# Patient Record
Sex: Female | Born: 1940 | Race: White | Hispanic: No | Smoking: Never smoker
Health system: Southern US, Community
[De-identification: ages and names within clinical notes are randomized; demographics above are authoritative.]

## PROBLEM LIST (undated history)

## (undated) DIAGNOSIS — F32A Depression, unspecified: Secondary | ICD-10-CM

## (undated) DIAGNOSIS — K449 Diaphragmatic hernia without obstruction or gangrene: Secondary | ICD-10-CM

## (undated) DIAGNOSIS — F329 Major depressive disorder, single episode, unspecified: Secondary | ICD-10-CM

## (undated) DIAGNOSIS — G588 Other specified mononeuropathies: Secondary | ICD-10-CM

## (undated) DIAGNOSIS — L0232 Furuncle of buttock: Secondary | ICD-10-CM

## (undated) DIAGNOSIS — M8580 Other specified disorders of bone density and structure, unspecified site: Secondary | ICD-10-CM

## (undated) DIAGNOSIS — F419 Anxiety disorder, unspecified: Secondary | ICD-10-CM

## (undated) DIAGNOSIS — M797 Fibromyalgia: Secondary | ICD-10-CM

## (undated) DIAGNOSIS — M419 Scoliosis, unspecified: Secondary | ICD-10-CM

## (undated) DIAGNOSIS — D649 Anemia, unspecified: Secondary | ICD-10-CM

## (undated) DIAGNOSIS — K589 Irritable bowel syndrome without diarrhea: Secondary | ICD-10-CM

## (undated) DIAGNOSIS — C801 Malignant (primary) neoplasm, unspecified: Secondary | ICD-10-CM

## (undated) DIAGNOSIS — M810 Age-related osteoporosis without current pathological fracture: Secondary | ICD-10-CM

## (undated) HISTORY — DX: Scoliosis, unspecified: M41.9

## (undated) HISTORY — PX: FOOT SURGERY: SHX648

## (undated) HISTORY — PX: NECK SURGERY: SHX720

## (undated) HISTORY — DX: Fibromyalgia: M79.7

## (undated) HISTORY — DX: Anemia, unspecified: D64.9

## (undated) HISTORY — DX: Irritable bowel syndrome without diarrhea: K58.9

## (undated) HISTORY — PX: CHOLECYSTECTOMY: SHX55

## (undated) HISTORY — DX: Other specified mononeuropathies: G58.8

## (undated) HISTORY — DX: Furuncle of buttock: L02.32

## (undated) HISTORY — DX: Malignant (primary) neoplasm, unspecified: C80.1

## (undated) HISTORY — DX: Age-related osteoporosis without current pathological fracture: M81.0

## (undated) HISTORY — PX: SHOULDER SURGERY: SHX246

## (undated) HISTORY — DX: Other specified disorders of bone density and structure, unspecified site: M85.80

## (undated) HISTORY — PX: GALLBLADDER SURGERY: SHX652

---

## 2000-06-03 ENCOUNTER — Inpatient Hospital Stay (HOSPITAL_COMMUNITY): Admission: EM | Admit: 2000-06-03 | Discharge: 2000-06-04 | Payer: Self-pay | Admitting: Obstetrics and Gynecology

## 2000-06-03 ENCOUNTER — Encounter: Payer: Self-pay | Admitting: Obstetrics and Gynecology

## 2000-06-04 ENCOUNTER — Encounter: Payer: Self-pay | Admitting: Internal Medicine

## 2001-05-03 ENCOUNTER — Ambulatory Visit (HOSPITAL_COMMUNITY): Admission: RE | Admit: 2001-05-03 | Discharge: 2001-05-03 | Payer: Self-pay | Admitting: Neurosurgery

## 2001-05-14 ENCOUNTER — Inpatient Hospital Stay (HOSPITAL_COMMUNITY): Admission: RE | Admit: 2001-05-14 | Discharge: 2001-05-19 | Payer: Self-pay | Admitting: Neurosurgery

## 2001-06-30 ENCOUNTER — Ambulatory Visit (HOSPITAL_COMMUNITY): Admission: RE | Admit: 2001-06-30 | Discharge: 2001-06-30 | Payer: Self-pay | Admitting: Neurosurgery

## 2001-08-25 ENCOUNTER — Encounter (HOSPITAL_COMMUNITY): Admission: RE | Admit: 2001-08-25 | Discharge: 2001-09-24 | Payer: Self-pay | Admitting: Neurosurgery

## 2003-02-07 ENCOUNTER — Emergency Department (HOSPITAL_COMMUNITY): Admission: EM | Admit: 2003-02-07 | Discharge: 2003-02-08 | Payer: Self-pay | Admitting: *Deleted

## 2003-02-07 ENCOUNTER — Encounter: Payer: Self-pay | Admitting: *Deleted

## 2003-02-17 ENCOUNTER — Encounter: Payer: Self-pay | Admitting: *Deleted

## 2003-02-17 ENCOUNTER — Ambulatory Visit (HOSPITAL_COMMUNITY): Admission: RE | Admit: 2003-02-17 | Discharge: 2003-02-17 | Payer: Self-pay | Admitting: *Deleted

## 2003-02-24 ENCOUNTER — Ambulatory Visit (HOSPITAL_COMMUNITY): Admission: RE | Admit: 2003-02-24 | Discharge: 2003-02-24 | Payer: Self-pay | Admitting: Emergency Medicine

## 2003-02-24 ENCOUNTER — Encounter: Payer: Self-pay | Admitting: Emergency Medicine

## 2003-05-05 ENCOUNTER — Ambulatory Visit (HOSPITAL_COMMUNITY): Admission: RE | Admit: 2003-05-05 | Discharge: 2003-05-05 | Payer: Self-pay | Admitting: Thoracic Surgery

## 2003-05-05 ENCOUNTER — Encounter: Payer: Self-pay | Admitting: Thoracic Surgery

## 2003-08-31 ENCOUNTER — Ambulatory Visit (HOSPITAL_COMMUNITY): Admission: RE | Admit: 2003-08-31 | Discharge: 2003-08-31 | Payer: Self-pay | Admitting: Family Medicine

## 2003-09-17 ENCOUNTER — Ambulatory Visit (HOSPITAL_COMMUNITY): Admission: RE | Admit: 2003-09-17 | Discharge: 2003-09-17 | Payer: Self-pay | Admitting: Thoracic Surgery

## 2003-09-17 ENCOUNTER — Encounter: Payer: Self-pay | Admitting: Thoracic Surgery

## 2004-03-27 ENCOUNTER — Ambulatory Visit (HOSPITAL_COMMUNITY): Admission: RE | Admit: 2004-03-27 | Discharge: 2004-03-27 | Payer: Self-pay | Admitting: Thoracic Surgery

## 2004-04-26 ENCOUNTER — Ambulatory Visit (HOSPITAL_COMMUNITY): Admission: RE | Admit: 2004-04-26 | Discharge: 2004-04-26 | Payer: Self-pay | Admitting: Family Medicine

## 2006-05-20 ENCOUNTER — Emergency Department (HOSPITAL_COMMUNITY): Admission: EM | Admit: 2006-05-20 | Discharge: 2006-05-20 | Payer: Self-pay | Admitting: Emergency Medicine

## 2006-05-28 ENCOUNTER — Ambulatory Visit (HOSPITAL_COMMUNITY): Admission: RE | Admit: 2006-05-28 | Discharge: 2006-05-28 | Payer: Self-pay | Admitting: Family Medicine

## 2006-06-10 ENCOUNTER — Ambulatory Visit (HOSPITAL_COMMUNITY): Admission: RE | Admit: 2006-06-10 | Discharge: 2006-06-10 | Payer: Self-pay | Admitting: Family Medicine

## 2006-07-11 ENCOUNTER — Ambulatory Visit (HOSPITAL_COMMUNITY): Admission: RE | Admit: 2006-07-11 | Discharge: 2006-07-11 | Payer: Self-pay | Admitting: Family Medicine

## 2006-09-17 ENCOUNTER — Ambulatory Visit (HOSPITAL_COMMUNITY): Admission: RE | Admit: 2006-09-17 | Discharge: 2006-09-17 | Payer: Self-pay | Admitting: Pulmonary Disease

## 2007-06-03 ENCOUNTER — Ambulatory Visit (HOSPITAL_COMMUNITY): Admission: RE | Admit: 2007-06-03 | Discharge: 2007-06-03 | Payer: Self-pay | Admitting: Family Medicine

## 2007-06-24 ENCOUNTER — Ambulatory Visit (HOSPITAL_COMMUNITY): Admission: RE | Admit: 2007-06-24 | Discharge: 2007-06-24 | Payer: Self-pay | Admitting: Family Medicine

## 2009-01-22 ENCOUNTER — Inpatient Hospital Stay (HOSPITAL_COMMUNITY): Admission: EM | Admit: 2009-01-22 | Discharge: 2009-01-25 | Payer: Self-pay | Admitting: Emergency Medicine

## 2009-06-14 ENCOUNTER — Encounter: Admission: RE | Admit: 2009-06-14 | Discharge: 2009-06-14 | Payer: Self-pay | Admitting: Neurosurgery

## 2009-09-14 ENCOUNTER — Encounter: Admission: RE | Admit: 2009-09-14 | Discharge: 2009-09-14 | Payer: Self-pay | Admitting: Neurosurgery

## 2010-02-22 ENCOUNTER — Ambulatory Visit (HOSPITAL_COMMUNITY): Admission: RE | Admit: 2010-02-22 | Discharge: 2010-02-22 | Payer: Self-pay | Admitting: Family Medicine

## 2010-03-15 ENCOUNTER — Ambulatory Visit (HOSPITAL_COMMUNITY): Admission: RE | Admit: 2010-03-15 | Discharge: 2010-03-15 | Payer: Self-pay | Admitting: Family Medicine

## 2010-11-22 ENCOUNTER — Ambulatory Visit (HOSPITAL_COMMUNITY)
Admission: RE | Admit: 2010-11-22 | Discharge: 2010-11-22 | Payer: Self-pay | Source: Home / Self Care | Attending: Family Medicine | Admitting: Family Medicine

## 2010-12-31 ENCOUNTER — Encounter: Payer: Self-pay | Admitting: Thoracic Surgery

## 2011-03-27 LAB — COMPREHENSIVE METABOLIC PANEL
Albumin: 3.1 g/dL — ABNORMAL LOW (ref 3.5–5.2)
BUN: 12 mg/dL (ref 6–23)
CO2: 29 mEq/L (ref 19–32)
Calcium: 8.2 mg/dL — ABNORMAL LOW (ref 8.4–10.5)
Chloride: 105 mEq/L (ref 96–112)
Creatinine, Ser: 0.86 mg/dL (ref 0.4–1.2)
GFR calc Af Amer: >60 mL/min (ref >60)
GFR calc non Af Amer: >60 mL/min (ref >60)
Glucose, Bld: 137 mg/dL — ABNORMAL HIGH (ref 70–99)

## 2011-03-27 LAB — URINE MICROSCOPIC-ADD ON

## 2011-03-27 LAB — URINALYSIS, ROUTINE W REFLEX MICROSCOPIC
Bilirubin Urine: NEGATIVE
Protein, ur: NEGATIVE mg/dL

## 2011-03-27 LAB — CBC
HCT: 32.6 % — ABNORMAL LOW (ref 36.0–46.0)
Hemoglobin: 11.1 g/dL — ABNORMAL LOW (ref 12.0–15.0)
MCV: 97.3 fL (ref 78.0–100.0)
RBC: 3.35 MIL/uL — ABNORMAL LOW (ref 3.87–5.11)
WBC: 10.3 10*3/uL (ref 4.0–10.5)

## 2011-03-27 LAB — DIFFERENTIAL
Basophils Absolute: 0 10*3/uL (ref 0.0–0.1)
Basophils Relative: 0 % (ref 0–1)
Lymphocytes Relative: 7 % — ABNORMAL LOW (ref 12–46)
Lymphs Abs: 0.7 10*3/uL (ref 0.7–4.0)
Monocytes Relative: 9 % (ref 3–12)

## 2011-03-27 LAB — CARDIAC PANEL(CRET KIN+CKTOT+MB+TROPI)
CK, MB: 1.2 ng/mL (ref 0.3–4.0)
CK, MB: 1.2 ng/mL (ref 0.3–4.0)
Relative Index: INVALID (ref 0.0–2.5)
Relative Index: INVALID (ref 0.0–2.5)
Total CK: 52 U/L (ref 7–177)
Total CK: 52 U/L (ref 7–177)
Troponin I: <0.01 ng/mL (ref 0.00–0.06)

## 2011-03-27 LAB — BASIC METABOLIC PANEL
BUN: 6 mg/dL (ref 6–23)
Chloride: 109 mEq/L (ref 96–112)
Creatinine, Ser: 0.68 mg/dL (ref 0.4–1.2)
GFR calc non Af Amer: >60 mL/min (ref >60)
Potassium: 3.4 mEq/L — ABNORMAL LOW (ref 3.5–5.1)

## 2011-04-24 NOTE — Procedures (Signed)
Krystal Carpenter, Krystal Carpenter                ACCOUNT NO.:  [id number]   MEDICAL RECORD NO.:  [id number]          PATIENT TYPE:  OUT   LOCATION:  RESP                          FACILITY:  [facility]   PHYSICIAN:  Edward L. Juanetta Gosling, M.D.DATE OF BIRTH:  Oct 14, 1941   DATE OF PROCEDURE:  06/24/2007  DATE OF DISCHARGE:  06/24/2007                            PULMONARY FUNCTION TEST   FINDINGS:  1. Spirometry shows no definite ventilatory defect but does show      evidence of air flow obstruction.  2. Lung volumes show air-trapping.  3. DLCO is normal.      Edward L. Juanetta Gosling, M.D.  Electronically Signed     ELH/MEDQ  D:  06/25/2007  T:  06/26/2007  Job:  [id number]   cc:   Lorin Picket A. Gerda Diss, MD  Fax: [phone]

## 2011-04-24 NOTE — H&P (Signed)
[name], Carpenter                ACCOUNT NO.:  [id number]   MEDICAL RECORD NO.:  [id number]          PATIENT TYPE:  INP   LOCATION:  A313                          FACILITY:  [facility]   PHYSICIAN:  Lonia Blood, M.D.      DATE OF BIRTH:  [birth year]   DATE OF ADMISSION:  01/22/2009  DATE OF DISCHARGE:  LH                              HISTORY & PHYSICAL   PRIMARY CARE PHYSICIAN:  Dr. Gerda Diss.   PRESENTING COMPLAINT:  Back pain.   HISTORY OF PRESENT ILLNESS:  The patient is a 70 year old female with a  history of fibromyalgia, COPD and depression who has apparently had  longstanding history in her left lower back.  This has gotten worse  lately and today she was unable to put weight on her feet.  The pain is  in the lower back, rated as 10/10, radiating down her left hip and left  leg.  She denied any recent trauma.  She has denied any history of  cancer.  No fever.  She took some pain medicine at home but that is not  relieving her pain.  She was treated in the emergency room also with  some IV pain medicine but she remained in intractable pain, hence she is  being admitted for further management.   PAST MEDICAL HISTORY:  Significant for COPD, fibromyalgia, depression.   ALLERGIES:  She has allergy to PENICILLIN.   MEDICATIONS:  1. Effexor 75 mg daily.  2. Trazodone 50 mg daily.   SOCIAL HISTORY:  [place]  Denied  any active tobacco, alcohol or IV drug use.   FAMILY HISTORY:  Not significant.   REVIEW OF SYSTEMS:  A 14 point review of systems was negative except for  HPI.   PHYSICAL EXAMINATION:  VITAL SIGNS:  Temperature 98.3, blood pressure  128/71, pulse 84, respiratory rate 18, saturation 98% on room air.  GENERAL:  The patient is acutely ill looking.  She looks in distress due  to pain.  Talking is somewhat difficult.  HEENT:  PERRL, EOMI.  NECK:  Supple.  No JVD, no lymphadenopathy.  LUNGS:  She has good air entry  bilaterally.  No  wheezing, no rales.  CARDIOVASCULAR:  The patient is  S1, S2, no murmur.  ABDOMEN:  Soft, nontender.  Positive bowel sounds.  EXTREMITIES:  Show no edema, cyanosis or clubbing.  NEUROLOGICAL:  Cranial nerves II-XII appear to be intact.  Her power is 5/5 of her  upper and lower extremities respectively.  She has a mild left-sided  positive __________.  Otherwise no other significant findings.   LABS:  Currently pending.   X-ray of the lumbar spine one view showed moderate lumbar scoliosis,  mild to moderate multilevel degenerative disk disease and spondylosis,  no acute abnormalities.   ASSESSMENT:  This is a 70 year old female with known history of  fibromyalgia but also lower back pain presenting with intractable back  pain.  The cause is not entirely clear on the x-ray but this could be  due to a number of reasons including degenerative disk  disease as well  as some element of sciatica.   PLAN:  1. Intractable pain.  Will admit the patient for inpatient management.      Mainly pain control.  Will check MRI of the L-spine.  Put her on      some bed rest.  For 24 hour pain will get PT and OT to see the      patient.  Once the patient's condition improves will set her up for      possible neurologic neurosurgical evaluation if her MRI warrants      it.  2. History of depression with fibromyalgia.  Will continue her home      medication at this point.  Any further treatment will depend on how      the patient does with these initial measures.      Lonia Blood, M.D.  Electronically Signed     LG/MEDQ  D:  01/23/2009  T:  01/23/2009  Job:  [id number]

## 2011-04-24 NOTE — Discharge Summary (Signed)
NAMECHRYSTEN, Krystal Carpenter                ACCOUNT NO.:  [id number]   MEDICAL RECORD NO.:  [id number]          PATIENT TYPE:  INP   LOCATION:  A313                          FACILITY:  [facility]   PHYSICIAN:  Scott A. Gerda Diss, MD    DATE OF BIRTH:  07/15/1941   DATE OF ADMISSION:  01/22/2009  DATE OF DISCHARGE:  LH                               DISCHARGE SUMMARY   DISCHARGE DIAGNOSES:  1. Severe sciatica due to foraminal impingement of the nerve.  2. Bulging in disk with degenerative back disease.  3. Fibromyalgia.  4. Urinary tract infection.  5. Musculoskeletal chest pain.  6. Reflux.   HOSPITAL COURSE:  This patient was admitted with intractable pain,  unable to put any weight on her leg.  The severe pain discomfort  radiating down the leg became worse over the previous few days.  She was  admitted and placed on Lovenox as a precaution and had significant  discomfort, required pain medication.  The first pain medicine might  have been little strong, and she had some problems with O2 sat being  low.  She was given O2 and she was able to converse and it should also  be noted that the patient had a fever of 100.9 at that point.  She in  addition to this have a chest x-ray, which showed no acute disease, and  she was placed on lower dose of pain medicine.  She also had a urine  done.  This urine showed a microscopic, some wbc's, so she is placed on  Levaquin to cover for probable UTI, and this did in fact help.  Her  urine culture was never done, so treatment was presumptive treatment  based on urinalysis.  The patient was able to move some with physical  therapy and it should also be noted she had a little bit of indigestion  on Sunday, was placed on Protonix and Maalox and she also had some  musculoskeletal pain early Monday morning, cardiac enzymes ruled out,  and EKG looked good.  The patient did have an MRI done and this MRI  showed mild bulging disk at multiple levels and degenerative disk  at L4-  L5 and a facet degeneration bilateral with moderate left foraminal  narrowing and mild central canal stenosis.  It was felt that this was  most likely pressing on the nerve and causing her sciatic.  She was  given a dose of Solu-Medrol and she was felt stable for discharge on the  16th with plan for discharge of the prednisone taper along with setting  her up with neurosurgery and Levaquin for few days for UTI coverage and  Vicodin as needed for severe pain.  She was instructed to followup in  the office on p.r.n. basis.  We will call her within the next couple  days per Neurosurgery appointment, and she certainly can call us with  any questions or concerns.      Scott A. Gerda Diss, MD  Electronically Signed     SAL/MEDQ  D:  01/25/2009  T:  01/25/2009  Job:  [id number]

## 2011-04-27 NOTE — Op Note (Signed)
[place]. [facility]  Patient:    Krystal Carpenter, Krystal Carpenter                       MRN: [id number] Proc. Date: 05/14/01 Adm. Date:  [id number] Attending:  Tressie Stalker D                           Operative Report  PREOPERATIVE DIAGNOSES:  Chiari I malformation.  POSTOPERATIVE DIAGNOSIS:  Chiari I malformation.  PROCEDURES: 1. Suboccipital craniectomy. 2. C1 laminectomy. 3. Duraplasty.  SURGEON:  Cristi Loron, M.D.  ASSISTANT:  Danae Orleans. Venetia Maxon, M.D.  ANESTHESIA:  General endotracheal.  ESTIMATED BLOOD LOSS:  150 cc.  SPECIMENS:  None.  DRAINS:  None.  COMPLICATIONS:  None.  BRIEF HISTORY:  The patient is a 70 year old white female who has suffered from one year of neck and shoulder pain.  She failed medical management and was worked up as an outpatient with a cervical MRI, which demonstrated some mild degenerative disease and spondylosis but also a Chiari I malformation. She failed medical management and therefore weighed the risks, benefits, and alternatives of surgery and decided to proceed with decompression.  DESCRIPTION OF PROCEDURE:  The patient was brought to the operating room by the anesthesia team.  General endotracheal anesthesia was induced.  I then applied the Mayfield three-point head rest to the patients calvarium.  She was then turned to the prone position on the chest rolls.  Her suboccipital region was then shaved and prepared with Betadine scrub and Betadine solution. Sterile drapes were applied.  I then injected the area to be incised with Marcaine with epinephrine solution and used a scalpel to make a midline incision over the suboccipital and upper cervical region.  I used electrocautery to dissect down to perform a subperiosteal dissection, stripping the cervical musculature from the suboccipital bone as well as the C1 and C2 laminae.  I inserted the cerebellar retractor for exposure.  I then used a high-speed drill to  thin out the suboccipital bone, i.e., drilled out until there was a thin shell of bone over the dura.  I also used the high-speed drill to drill off the posterior lamina of the C1 ring.  I then used the Kerrison punch to remove the remainder of the suboccipital bone, exposing the suboccipital dura as well as remove the remainder of the C1 ring. I cleared the soft tissue from overlying the dura.  There was an occipital fibrous band at the foramen magnum, which I removed using the nerve hook and the Kerrison punch.  The patient did have a rather large occipital dural sinus, which bled profusely when I cut it, and I controlled it with electrocautery and vascular clips.  I cut the dura in a Y-shaped fashion, and this did compress the cerebellar tonsils and the upper surface of the spinal cord.  There appeared to be good flow of CSF.  I then obtained Dura-Guard pericardium and performed a duraplasty by using a combination of interrupted and running 4-0 Nurolon suture.  I got a pretty good dural closure.  I had anesthesia Valsalva the patient, and there was no CSF leak.  I then copiously irrigated the wound with bacitracin solution and removed the solution.  I achieved stringent hemostasis using bipolar electrocautery.  I then removed the cerebellar retractor and reapproximated the patients cervical fascia with interrupted #1 Vicryl suture as well as the subcutaneous tissue with interrupted  2-0 Vicryl suture.  Skin was then reapproximated with Steri-Strips with benzoin.  The wound was then coated with bacitracin ointment and a sterile dressing applied.  The drapes were removed, and the patient was subsequently returned to the supine position, and the Mayfield three-point head rest was removed from her calvarium.  The patient was subsequently extubated by the anesthesia team and transported to the postanesthesia care unit in stable condition.  All sponge, instrument, and needle counts were correct  at the end of this case. DD:  05/14/01 TD:  05/15/01 Job: [id number]

## 2011-04-27 NOTE — Procedures (Signed)
   NAMEDREMA, EDDINGTON                            ACCOUNT NO.:  [id number]   MEDICAL RECORD NO.:  [id number]                    PATIENT TYPE:   LOCATION:                                       FACILITY:   PHYSICIAN:  Edward L. Juanetta Gosling, M.D.             DATE OF BIRTH:   DATE OF PROCEDURE:  DATE OF DISCHARGE:                              PULMONARY FUNCTION TEST   IMPRESSION:  1. Spirometry shows airflow obstruction at the level of the small airways.  2. There is no significant bronchodilator improvement.      ___________________________________________                                            Oneal Deputy. Juanetta Gosling, M.D.   ELH/MEDQ  D:  09/02/2003  T:  09/03/2003  Job:  [id number]   cc:   Lorin Picket A. Gerda Diss, M.D.  [address], [address]  [place]  [place] [address]  Fax: [phone]

## 2011-04-27 NOTE — H&P (Signed)
[place]. [facility]  Patient:    Krystal Carpenter, Krystal Carpenter                       MRN: [id number] Adm. Date:  [id number] Attending:  Tressie Stalker D                         History and Physical  CHIEF COMPLAINT:  Neck pain.  HISTORY OF PRESENT ILLNESS:  The patient is a 70 year old white female who began having neck pain about 1 year ago.  She had been treated with multiple modalities including chiropractic care, medications, rest etc.  She failed to improve and was worked up with an cervical MRI that demonstrated a Chiari-1 malformation.  The patient was kindly sent for my consultation.  She complains of posterior cervical pain with some radiation to her bilateral trapezius as well as pain "at the base of my skull."  She has not noticed any significant numbness, tingling, or weakness, ataxia, incontinence.  She has been getting worse despite medical management.  PAST MEDICAL HISTORY:  Positive for irritable bowel syndrome, a remote history of cholecystitis/cholelithiasis, depression, scoliosis and intermittent sciatica.  PAST SURGICAL HISTORY:  Foot surgery 1998, shoulder surgery 1997 on the right, cholecystectomy 2000.  MEDICATIONS:  Prior to admission: 1. Effexor 75 mg p.o. q. day. 2. Prempro 0.625/25 1 p.o. q. day. 3. Trazodone 50 mg h.s. p.r.n.  DRUG ALLERGIES:  PENICILLIN causes hives and swelling.  FAMILY HISTORY:  The patients mother died in her 70s of congestive heart failure.  The patients father died at age 70 of probable heart disease.  SOCIAL HISTORY:  The patient is divorced.  She has 3 children.  She lives in [place], tobacco, ethanol or drug use.  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  REVIEW OF SYSTEMS:  Negative except as above.  GENERAL:  A pleasant well-nourished well-developed 70 year old white female complaining of neck pain.  VITAL SIGNS:  Height 5 foot 4 inches, weight 124  pounds.  HEENT:  Normal.  NECK:  She has limited cervical range of motion.  She has diffuse tenderness to palpation, no point tenderness or obvious deformities.  Spurlings test is negative bilaterally with limited motion.  lhermittes sign was not present. There was no jugular venous distension, carotid bruits, masses, etc.  Thorax is symmetric.  LUNGS:  Clear to auscultation.  HEART:  Regular rate and rhythm.  ABDOMEN:  Soft, nontender.  EXTREMITIES:  She has a well-healed right shoulder scar with no signs of infection with a normal range of motion.  BACK EXAM:  The patient has some scoliosis but no other obvious deformities. faberes testing and straight leg raise testing were negative bilaterally.  NEUROLOGIC:  The patient is alert and oriented x 3.  Cranial nerves II-XII are grossly intact bilaterally.  Vision is grossly normal bilaterally with corrective lenses.  Hearing grossly normal bilaterally.  Motor strength is 5/5 in the bilateral deltoid, bicep, tricep, hand grip and wrist extensor. Interosseous psoas, quadriceps, gastrocnemius, extensor hallucis longus, cerebellar exam is intact to rapid alternating movements in the upper extremity bilaterally.  Sensory exam is normal to light touch and pinprick sensation in all tested dermatomes bilaterally.  Deep tendon reflexes are 2+/4 in the bilateral biceps, triceps, brachial radialis, 3/4 in her bilateral quadriceps, and gastrocnemius.  She has bilateral flexor plantar responses and a 4 beat nonsustained ankle clonus bilaterally.  IMAGING STUDIES:  The patient had a cervical MRI performed without contrast at [facility] on Apr 23, 2001.  This demonstrates a mildly straightened cervical spine with some mild bulging disks and spondylosis but no significant neural compression.  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  ASSESSMENT AND PLAN: 1. Chiari-1 malformation.  I have discussed  the situation with the patient and    her daughter and reviewed the MRI scan with them pointing out the    abnormalities.  Her symptoms are consistent with a symptomatic Chiari-1    malformation.  I have discussed the various treatment options with her    including continued medical management, serial MRIs, and surgery.  I have    described the procedure of a suboccipital craniectomy with C1 and possibly    C2 laminectomy and duraplasty.  I have described the surgery to them    showing them surgical models and discussed the risks of surgery    extensively.  The patient has weighed the risks, benefits, and alternatives of surgery and has decided to proceed with the operation on May 14, 2001.  The patient has a normal brain MRI other than the Chiari-1 malformation. DD:  05/14/01 TD:  05/14/01 Job: [id number]

## 2011-04-27 NOTE — Discharge Summary (Signed)
[name]. [facility]  Patient:    Krystal Carpenter, Krystal Carpenter Visit Number: [id number] MRN: [id number]          Service Type: [facility] Attending Physician:  Tressie Stalker D Dictated by:   Cristi Loron, M.D. Admit Date:  08/25/2001 Discharge Date: 09/24/2001                             Discharge Summary  For full details of this admission, please refer to the typed History and Physical.  BRIEF HISTORY:  The patient is a 70 year old white female who suffered from one year of neck and shoulder pain.  She failed medical management, worked up as an outpatient with a cervical MRI which demonstrated a some mild degenerative disease and spondylosis but she also had a Chiari I malformation. She failed medical management and therefore weighed the risks, benefits, alternatives of surgery and decided to proceed with a Chiari I decompression.  For further details of this procedure, please refer to the History and Physical.  HOSPITAL COURSE:  I admitted the patient to [facility] on May 14, 2001, with the diagnosis of a Chiari I malformation.  On the day of admission I performed a suboccipital craniectomy with C1 laminectomy and duraplasty. The surgery went well without complications.  (For further details of this operation, please refer to the typed operative note.)  POSTOPERATIVE COURSE:  The patients postoperative course was essentially unremarkable.  She was initially monitored in the neurosurgical intensive care unit.  She had some persistent nausea which was treated with medications.  She was transferred to the neurosurgical general floor and observed.  She continued to complain of headaches.  We got a cranial CT scan which demonstrated expected post-surgical changes and no evidence of hydrocephalus. By May 19, 2001, the patient requested discharge to home.  She was afebrile. Vital signs were stable.  She was eating well.  Her wound was healing well without  signs of infection.  She was neurologically normal.  She was therefore discharged to home on May 19, 2001.  DISCHARGE INSTRUCTIONS:  The patient was instructed to follow up with me in two weeks, to resume outpatient medical regimen, and she was given written discharge instructions.  FINAL DIAGNOSIS:  Chiari I malformation.  PROCEDURE PERFORMED:  A suboccipital craniectomy, C1 laminectomy, and duraplasty. Dictated by:   Cristi Loron, M.D. Attending Physician:  Tressie Stalker D DD:  01/15/02 TD:  01/16/02 Job: [id number]

## 2011-04-27 NOTE — Discharge Summary (Signed)
[place]. [facility]  Patient:    Krystal Carpenter, Krystal Carpenter                       MRN: [id number] Adm. Date:  [id number] Disc. Date: 06/04/00 Attending:  Pricilla Riffle Dictator:   Delton See, P.A. CC:         Lilyan Punt, M.D. [address] [place], [place] [address]             Dietrich Pates, M.D. [facility]                           Discharge Summary  HISTORY OF PRESENT ILLNESS:  Ms. Sisler is a 70 year old female with a history of a normal heart catheterization three years ago at [facility]. Apparently, this was performed by Arturo Morton. Riley Kill, M.D. The patient developed chest pain around 8:30 or 9 a.m. on the a.m. of admission. She was working in a [employer]. She got very hot. She developed chest pain. She took some Tums without relief. She drank some Coke without relief. She had her daughter drive her to the emergency room. The symptoms were similar to the pain she experienced prior to her catheterization in the past. Her pain was eventually relieved with nitroglycerin and later morphine.  Cardiac risk factors were negative for hypertension, negative for diabetes, positive for elevated cholesterol, negative for tobacco, negative for obesity. Her mother had an enlarged heart. Her father had some sort of heart trouble. Her brother had an MI in his 51s.  PAST MEDICAL HISTORY:  Significant for irritable bowel syndrome, status post cholecystectomy, history of hiatal hernia.  ALLERGIES:  PENICILLIN.  MEDICATIONS:  1. Effexor 75 mg daily.  2. Trazodone 50 mg at bedtime.  3. She was started on IV nitroglycerin at [facility]. She had four     baby aspirin there as well.  4. She is also on Prempro 0.625/2.5 mg daily.  5. Fosamax once each week.  6. She was started on Lovenox at [facility].  SOCIAL HISTORY:  The patient lives in [place]. She is divorced. She has three children. She works in [employer]  HOSPITAL COURSE:  As noted, this patient was admitted to [facility] in transfer from [facility] for evaluation of chest pain. She has a history of a normal catheterization three to four years ago. She was noted to be bradycardic at time of admission with a heart rate of 54 beats per minute. She ruled out for an MI with negative cardiac enzymes.  A CT scan of the chest was performed to rule out pulmonary embolus. This showed no significant abnormality and no central pulmonary embolism.  On June 04, 2000, the patient underwent an adenosine Cardiolite stress test. This was interpreted as negative for ischemia with an ejection fraction of 77%. Arrangements were made to discharge the patient later that evening in improved condition. She would follow up with Dr. Gerda Diss for further evaluation.  LABORATORY DATA:  Please see results of Cardiolite study and spiral CT as noted above. Apparently, her cardiac enzymes were negative. There are no lab values in the chart at this time.  An EKG showed sinus bradycardia with a short P-R interval, rate 54 beats per minute.  A fasting lipid profile was performed, and results are pending.  DISCHARGE MEDICATIONS:  The patient was discharged on the same medications she was  on prior to admission, with the addition of Protonix 40 mg daily.  ACTIVITIES:  As tolerated.  DIET:  The patient was told to stay on her previous diet.  FOLLOW-UP:  She was to follow up with Dr. Gerda Diss in one to two weeks. Dietrich Pates, M.D. as needed.  PROBLEM LIST AT TIME OF DISCHARGE:  1. Chest pain. Myocardial infarction ruled out.  2. Negative adenosine Cardiolite. Ejection fraction 77%.  3. Negative spiral CT scan.  4. History of anxiety and depression.  5. History of normal cardiac catheterization three to four years ago.  6. History of elevated cholesterol, lipid profile pending.  7. Positive family history of coronary artery disease.  8.  Irritable bowel syndrome.  9. Gastroesophageal reflux disease and hiatal hernia. 10. Status post cholecystectomy. 11. Bradycardia of uncertain etiology. DD:  06/04/00 TD:  06/04/00 Job: [id number]

## 2011-06-28 ENCOUNTER — Other Ambulatory Visit: Payer: Self-pay | Admitting: Neurosurgery

## 2011-06-28 DIAGNOSIS — M419 Scoliosis, unspecified: Secondary | ICD-10-CM

## 2011-07-05 ENCOUNTER — Other Ambulatory Visit: Payer: Self-pay | Admitting: Neurosurgery

## 2011-07-05 ENCOUNTER — Ambulatory Visit
Admission: RE | Admit: 2011-07-05 | Discharge: 2011-07-05 | Disposition: A | Discharge status: Home / Self Care | Payer: Medicare Other | Source: Ambulatory Visit | Attending: Neurosurgery | Admitting: Neurosurgery

## 2011-07-05 DIAGNOSIS — M419 Scoliosis, unspecified: Secondary | ICD-10-CM

## 2011-07-05 MED ORDER — IOHEXOL 180 MG/ML  SOLN
1.0000 mL | Freq: Once | INTRAMUSCULAR | Status: AC | PRN
  Administered 2011-07-05: 1 mL via EPIDURAL

## 2011-07-05 MED ORDER — METHYLPREDNISOLONE ACETATE 40 MG/ML INJ SUSP (RADIOLOG
120.0000 mg | Freq: Once | INTRAMUSCULAR | Status: AC
  Administered 2011-07-05: 120 mg via EPIDURAL

## 2011-08-01 ENCOUNTER — Ambulatory Visit (INDEPENDENT_AMBULATORY_CARE_PROVIDER_SITE_OTHER): Payer: Medicare Other | Admitting: Orthopedic Surgery

## 2011-08-01 ENCOUNTER — Encounter: Payer: Self-pay | Admitting: Orthopedic Surgery

## 2011-08-01 VITALS — Ht 63.0 in | Wt 125.0 lb

## 2011-08-01 DIAGNOSIS — M25519 Pain in unspecified shoulder: Secondary | ICD-10-CM

## 2011-08-01 DIAGNOSIS — M7500 Adhesive capsulitis of unspecified shoulder: Secondary | ICD-10-CM

## 2011-08-01 DIAGNOSIS — M719 Bursopathy, unspecified: Secondary | ICD-10-CM

## 2011-08-01 DIAGNOSIS — M75101 Unspecified rotator cuff tear or rupture of right shoulder, not specified as traumatic: Secondary | ICD-10-CM | POA: Insufficient documentation

## 2011-08-01 DIAGNOSIS — M7501 Adhesive capsulitis of right shoulder: Secondary | ICD-10-CM

## 2011-08-01 DIAGNOSIS — M25511 Pain in right shoulder: Secondary | ICD-10-CM

## 2011-08-01 MED ORDER — METHYLPREDNISOLONE ACETATE 40 MG/ML IJ SUSP: 40.0000 mg | Freq: Once | INTRAMUSCULAR | Status: DC

## 2011-08-01 NOTE — Progress Notes (Signed)
Chief complaint: RIGHT shoulder pain  Second opinion HPI:(4) This is a 70 year old female who was followed by [facility] presents with a 4 month history of gradual onset of throbbing RIGHT shoulder pain which is constant and is rated 8/10.  She received one injection in the subacromial space and it seemed to help for approximately one week.  She reports decreased range of motion and catching when she moves the arm past 90.  The pain will sometimes radiating into the hand but this is only on occasion and primarily stops above the elbow.  She has not had any other medication or physical therapy.    She is also followed by Dr. Channing Mutters for cervical pathology and is status post Chiari one malformation with suboccipital craniectomy and C1 laminectomy and duraplasty by Dr. Lovell Sheehan and Venetia Maxon June of 2002.  [facility] has performed a MRI under the direction of Dr. Madelon Lips that was done May 29, 2011 and it shows a normal type II acromion normal glenohumeral joint a subtle superficial bursal surface partial thickness tear of the anterior aspect of the supraspinatus tendon with mild overlying subacromial bursitis.  Previous distal clavicle excision noted.   ROS:(2) Denies numbness or tingling of the hand does complain of depression.  PFSH: (1)  Past Medical History  Diagnosis Date  . IBS (irritable bowel syndrome)   . Fibromyalgia   . Scoliosis     Past Surgical History  Procedure Date  . Gallbladder surgery   . Neck surgery   . Foot surgery     bilateral  . Shoulder surgery     distal clavicle date unknown    History   Social History  . Marital Status: Divorced    Spouse Name: N/A    Number of Children: N/A  . Years of Education: N/A   Occupational History  . Not on file.   Social History Main Topics  . Smoking status: Never Smoker   . Smokeless tobacco: Not on file  . Alcohol Use: No  . Drug Use: Not on file  . Sexually Active: Not on file   Other Topics Concern   . Not on file   Social History Narrative  . No narrative on file     Physical Exam(12) GENERAL: normal development   CDV: pulses are normal   Skin: normal  Lymph: nodes were not palpable/normal  Psychiatric: awake, alert and oriented  Neuro: normal sensation  MSK Reflexes are 2+ to 3+ in the upper extremities 1 There is mild tenderness over the acromioclavicular joint an anterolateral edge of the acromion no deformity was seen although the distal clavicle was slightly prominent 2 Active range of motion RIGHT shoulder flexion equals 100, passive range of motion the same.  External rotation 45 active and passive.  Internal rotation level L 4 3 Stability testing was difficult but the subluxation test inferiorly was normal 4 Internal and external rotation strength are 5 minus/5 we did not test for elevation.  6 LEFT shoulder normal range of motion, strength and stability no tenderness  Imaging: Plain films from the previous hospital No gross abnormalities other than chronic changes at the greater tuberosity  Assessment:  1 rotator cuff syndrome 2 adhesive capsulitis 3 partial rotator cuff tear    Plan: Lidocaine injection test with steroid and 10 cc lidocaine was performed  The post injection examination revealed the following  Improved forward elevation although not much improvement in terms of pain.  I was able to forward elevate  to 130.    Recommend course of physical therapy.  And medication for pain as needed.  Following  in 4 weeks to reevaluate the shoulder

## 2011-08-01 NOTE — Patient Instructions (Signed)
You have received a steroid shot. 15% of patients experience increased pain at the injection site with in the next 24 hours. This is best treated with ice and tylenol extra strength 2 tabs every 8 hours. If you are still having pain please call the office.   Start Physical therapy

## 2011-08-09 ENCOUNTER — Telehealth: Payer: Self-pay | Admitting: Orthopedic Surgery

## 2011-08-09 NOTE — Telephone Encounter (Signed)
Called; rec he speak with Dr Channing Mutters ref neck

## 2011-08-09 NOTE — Telephone Encounter (Signed)
Jim[facility] wants you to call him about patient, Krystal Carpenter (Dec 21, 2040)  You ordered therapy for ROT tear and Dr. Trey Sailors also sent her to therapy for bulging disc in her neck.Rosanne Ashing says he is getting conflicting symptons from the neck problem and the shoulder.  She is having a lot of pain going all way down to wrist.  Having a lot of trouble treating ROT d/t disc problems.  Asked if you will call him at [phone]

## 2011-09-04 ENCOUNTER — Ambulatory Visit (INDEPENDENT_AMBULATORY_CARE_PROVIDER_SITE_OTHER): Payer: Medicare Other | Admitting: Orthopedic Surgery

## 2011-09-04 ENCOUNTER — Encounter: Payer: Self-pay | Admitting: Orthopedic Surgery

## 2011-09-04 VITALS — Ht 63.0 in | Wt 125.0 lb

## 2011-09-04 DIAGNOSIS — S43429A Sprain of unspecified rotator cuff capsule, initial encounter: Secondary | ICD-10-CM

## 2011-09-04 DIAGNOSIS — M75101 Unspecified rotator cuff tear or rupture of right shoulder, not specified as traumatic: Secondary | ICD-10-CM

## 2011-09-04 NOTE — Progress Notes (Signed)
Followup visit. Converted to pre-op appointment  Status post attempted physical therapy to remedy rotator cuff tear and pain in the setting of previous cervical surgery to repair leaking spinal fluid.  Continues to have pain, severe, primarily in the upper arm, but also noted with attempted forward elevation, and internal rotation. She would like to proceed with surgery. She understands that she may have residual pain from the condition of her cervical spine.  Open rotator cuff repair. Procedure explained.

## 2011-09-04 NOTE — Patient Instructions (Signed)
You have been scheduled for surgery.  All surgeries carry some risk.  Remember you always have the option of continued nonsurgical treatment. However in this situation the risks vs. the benefits favor surgery as the best treatment option. The risks of the surgery includes the following but is not limited to bleeding, infection, pulmonary embolus, death from anesthesia, nerve injury vascular injury or need for further surgery, continued pain.  Specific to this procedure the following risks and complications are rare but possible Pain, stiffness and weakness  Typical recovery is 6-9 months and sometimes 1 year  You have been scheduled for surgery  Please Go to your preoperative appointment and bring the folder that was given to you today  Please stop all blood thinners ibuprofen Naprosyn aspirin Plavix Coumadin

## 2011-09-07 ENCOUNTER — Other Ambulatory Visit: Payer: Self-pay

## 2011-09-07 ENCOUNTER — Encounter (HOSPITAL_COMMUNITY)
Admission: RE | Admit: 2011-09-07 | Discharge: 2011-09-07 | Disposition: A | Discharge status: Home / Self Care | Payer: Medicare Other | Source: Ambulatory Visit | Attending: Orthopedic Surgery | Admitting: Orthopedic Surgery

## 2011-09-07 ENCOUNTER — Encounter (HOSPITAL_COMMUNITY): Payer: Self-pay

## 2011-09-07 HISTORY — DX: Anxiety disorder, unspecified: F41.9

## 2011-09-07 HISTORY — DX: Depression, unspecified: F32.A

## 2011-09-07 HISTORY — DX: Diaphragmatic hernia without obstruction or gangrene: K44.9

## 2011-09-07 HISTORY — DX: Major depressive disorder, single episode, unspecified: F32.9

## 2011-09-07 LAB — BASIC METABOLIC PANEL
CO2: 32 mEq/L (ref 19–32)
Chloride: 104 mEq/L (ref 96–112)
Potassium: 3.7 mEq/L (ref 3.5–5.1)
Sodium: 142 mEq/L (ref 135–145)

## 2011-09-07 LAB — CBC
Platelets: 206 10*3/uL (ref 150–400)
RBC: 3.43 MIL/uL — ABNORMAL LOW (ref 3.87–5.11)
WBC: 5.6 10*3/uL (ref 4.0–10.5)

## 2011-09-07 LAB — SURGICAL PCR SCREEN
MRSA, PCR: NEGATIVE
Staphylococcus aureus: NEGATIVE

## 2011-09-07 MED ORDER — CHLORHEXIDINE GLUCONATE 4 % EX LIQD
Freq: Once | CUTANEOUS | Status: DC
  Filled 2011-09-07: qty 15

## 2011-09-07 MED ORDER — LACTATED RINGERS IV SOLN: INTRAVENOUS | Status: DC

## 2011-09-07 NOTE — Patient Instructions (Addendum)
20 ANU STAGNER  09/07/2011   Your procedure is scheduled on:  09/14/11  Report to [facility] at 830 AM.  Call this number if you have problems the morning of surgery: [phone]   Remember:   Do not eat food:After Midnight.  Do not drink clear liquids: After Midnight.  Take these medicines the morning of surgery with A SIP OF WATER: effexor   Do not wear jewelry, make-up or nail polish.  Do not wear lotions, powders, or perfumes. You may wear deodorant.  Do not shave 48 hours prior to surgery.  Do not bring valuables to the hospital.  Contacts, dentures or bridgework may not be worn into surgery.  Leave suitcase in the car. After surgery it may be brought to your room.  For patients admitted to the hospital, checkout time is 11:00 AM the day of discharge.   Patients discharged the day of surgery will not be allowed to drive home.  Name and phone number of your driver: family  Special Instructions: CHG Shower Use Special Wash: 1/2 bottle night before surgery and 1/2 bottle morning of surgery.   Please read over the following fact sheets that you were given: Pain Booklet, MRSA Information, Surgical Site Infection Prevention, Anesthesia Post-op Instructions and Care and Recovery After Surgery   PATIENT INSTRUCTIONS POST-ANESTHESIA  IMMEDIATELY FOLLOWING SURGERY:  Do not drive or operate machinery for the first twenty four hours after surgery.  Do not make any important decisions for twenty four hours after surgery or while taking narcotic pain medications or sedatives.  If you develop intractable nausea and vomiting or a severe headache please notify your doctor immediately.  FOLLOW-UP:  Please make an appointment with your surgeon as instructed. You do not need to follow up with anesthesia unless specifically instructed to do so.  WOUND CARE INSTRUCTIONS (if applicable):  Keep a dry clean dressing on the anesthesia/puncture wound site if there is drainage.  Once the wound has quit  draining you may leave it open to air.  Generally you should leave the bandage intact for twenty four hours unless there is drainage.  If the epidural site drains for more than 36-48 hours please call the anesthesia department.  QUESTIONS?:  Please feel free to call your physician or the hospital operator if you have any questions, and they will be happy to assist you.     [facility] Anesthesia Department [address] [place] [phone]

## 2011-09-13 NOTE — H&P (Signed)
Diagnoses     Shoulder pain, right   - Primary    719.41    Adhesive capsulitis of right shoulder     726.0    Rotator cuff syndrome of right shoulder     726.10      Reason for Visit     Shoulder Pain    Right shoulder pain for about 4 months, no injury. The pain is constant.        Vitals - Last Recorded       Ht Wt BMI    5\' 3"  (1.6 m)  125 lb (56.7 kg)  22.14 kg/m2          Progress Notes     Fuller Canada, MD  08/01/2011  3:27 PM  Signed Chief complaint: RIGHT shoulder pain   Second opinion HPI:(4) This is a 70 year old female who was followed by [facility] presents with a 4 month history of gradual onset of throbbing RIGHT shoulder pain which is constant and is rated 8/10.  She was initially treated with subacromial injection followed up in our practice for 2nd opinion after being evaluated by Dr. Channing Mutters. We saw her gave her one injection. She improved but then the pain came back. She decided not to continue with nonoperative therapy. When given the option of surgical treatment. She understands the risk and benefits of the surgery is going to take those risks in an attempt to improve. The pain in her RIGHT shoulder. She understands that she does have some cervical pathology which may give her some continued pain.   [facility] has performed a MRI under the direction of Dr. Madelon Lips that was done May 29, 2011 and it shows a normal type II acromion normal glenohumeral joint a subtle superficial bursal surface partial thickness tear of the anterior aspect of the supraspinatus tendon with mild overlying subacromial bursitis.  Previous distal clavicle excision noted.   ROS:(2) Denies numbness or tingling of the hand does complain of depression.   PFSH: (1)   Past Medical History   Diagnosis  Date   .  IBS (irritable bowel syndrome)     .  Fibromyalgia     .  Scoliosis         Past Surgical History   Procedure  Date   .  Gallbladder surgery     .   Neck surgery     .  Foot surgery         bilateral   .  Shoulder surgery         distal clavicle date unknown       History       Social History   .  Marital Status:  Divorced       Spouse Name:  N/A       Number of Children:  N/A   .  Years of Education:  N/A       Occupational History   .  Not on file.       Social History Main Topics   .  Smoking status:  Never Smoker    .  Smokeless tobacco:  Not on file   .  Alcohol Use:  No   .  Drug Use:  Not on file   .  Sexually Active:  Not on file       Other Topics  Concern   .  Not on file       Social History Narrative   .  No narrative on file        Physical Exam(12) GENERAL: normal development    CDV: pulses are normal    Skin: normal   Lymph: nodes were not palpable/normal   Psychiatric: awake, alert and oriented   Neuro: normal sensation   MSK Reflexes are 2+ to 3+ in the upper extremities RIGHT shoulder evaluation 1 There is mild tenderness over the acromioclavicular joint an anterolateral edge of the acromion no deformity was seen although the distal clavicle was slightly prominent 2 Active range of motion RIGHT shoulder flexion equals 100, passive range of motion the same.  External rotation 45 active and passive.  Internal rotation level L 4 3 Stability testing was difficult but the subluxation test inferiorly was normal 4 Internal and external rotation strength are 5 minus/5 we did not test for elevation.   6 LEFT shoulder normal range of motion, strength and stability no tenderness   Imaging: Plain films from the previous hospital No gross abnormalities other than chronic changes at the greater tuberosity   Assessment:   Partial rotator cuff tear, RIGHT shoulder.  Plan open rotator cuff repair

## 2011-09-14 ENCOUNTER — Encounter (HOSPITAL_COMMUNITY): Payer: Self-pay | Admitting: Anesthesiology

## 2011-09-14 ENCOUNTER — Ambulatory Visit (HOSPITAL_COMMUNITY): Payer: Medicare Other | Admitting: Anesthesiology

## 2011-09-14 ENCOUNTER — Ambulatory Visit (HOSPITAL_COMMUNITY)
Admission: RE | Admit: 2011-09-14 | Discharge: 2011-09-14 | Disposition: A | Discharge status: Home / Self Care | Payer: Medicare Other | Source: Ambulatory Visit | Attending: Orthopedic Surgery | Admitting: Orthopedic Surgery

## 2011-09-14 ENCOUNTER — Encounter (HOSPITAL_COMMUNITY)
Admission: RE | Disposition: A | Discharge status: Home / Self Care | Payer: Self-pay | Source: Ambulatory Visit | Attending: Orthopedic Surgery

## 2011-09-14 ENCOUNTER — Encounter (HOSPITAL_COMMUNITY): Payer: Self-pay

## 2011-09-14 DIAGNOSIS — Z01810 Encounter for preprocedural cardiovascular examination: Secondary | ICD-10-CM | POA: Insufficient documentation

## 2011-09-14 DIAGNOSIS — M25819 Other specified joint disorders, unspecified shoulder: Secondary | ICD-10-CM | POA: Insufficient documentation

## 2011-09-14 DIAGNOSIS — Z01812 Encounter for preprocedural laboratory examination: Secondary | ICD-10-CM | POA: Insufficient documentation

## 2011-09-14 DIAGNOSIS — M67919 Unspecified disorder of synovium and tendon, unspecified shoulder: Secondary | ICD-10-CM | POA: Insufficient documentation

## 2011-09-14 DIAGNOSIS — M75101 Unspecified rotator cuff tear or rupture of right shoulder, not specified as traumatic: Secondary | ICD-10-CM

## 2011-09-14 DIAGNOSIS — M719 Bursopathy, unspecified: Secondary | ICD-10-CM | POA: Insufficient documentation

## 2011-09-14 SURGERY — SHOULDER ACROMIOPLASTY
Anesthesia: General | Site: Shoulder | Laterality: Right | Wound class: Clean

## 2011-09-14 MED ORDER — METHOCARBAMOL 500 MG PO TABS: 500.0000 mg | ORAL_TABLET | Freq: Four times a day (QID) | ORAL | Status: AC

## 2011-09-14 MED ORDER — BUPIVACAINE-EPINEPHRINE 0.5% -1:200000 IJ SOLN
INTRAMUSCULAR | Status: DC | PRN
  Administered 2011-09-14: 60 mL

## 2011-09-14 MED ORDER — GLYCOPYRROLATE 0.2 MG/ML IJ SOLN
INTRAMUSCULAR | Status: AC
  Administered 2011-09-14: 0.2 mg via INTRAVENOUS
  Filled 2011-09-14: qty 1

## 2011-09-14 MED ORDER — FENTANYL CITRATE 0.05 MG/ML IJ SOLN
25.0000 ug | INTRAMUSCULAR | Status: DC | PRN
  Administered 2011-09-14: 25 ug via INTRAVENOUS

## 2011-09-14 MED ORDER — GLYCOPYRROLATE 0.2 MG/ML IJ SOLN
INTRAMUSCULAR | Status: AC
  Filled 2011-09-14: qty 1

## 2011-09-14 MED ORDER — SODIUM CHLORIDE 0.9 % IR SOLN
Status: DC | PRN
  Administered 2011-09-14: 1000 mL

## 2011-09-14 MED ORDER — ROCURONIUM BROMIDE 50 MG/5ML IV SOLN
INTRAVENOUS | Status: AC
  Filled 2011-09-14: qty 1

## 2011-09-14 MED ORDER — PROPOFOL 10 MG/ML IV EMUL
INTRAVENOUS | Status: AC
  Filled 2011-09-14: qty 20

## 2011-09-14 MED ORDER — NEOSTIGMINE METHYLSULFATE 1 MG/ML IJ SOLN
INTRAMUSCULAR | Status: AC
  Filled 2011-09-14: qty 10

## 2011-09-14 MED ORDER — VANCOMYCIN HCL IN DEXTROSE 1-5 GM/200ML-% IV SOLN: 1000.0000 mg | INTRAVENOUS | Status: DC

## 2011-09-14 MED ORDER — FENTANYL CITRATE 0.05 MG/ML IJ SOLN
INTRAMUSCULAR | Status: AC
  Filled 2011-09-14: qty 2

## 2011-09-14 MED ORDER — LACTATED RINGERS IV SOLN
INTRAVENOUS | Status: DC | PRN
  Administered 2011-09-14: 09:00:00 via INTRAVENOUS

## 2011-09-14 MED ORDER — LIDOCAINE HCL 2 % EX GEL
CUTANEOUS | Status: DC | PRN
  Administered 2011-09-14: 50 via TOPICAL

## 2011-09-14 MED ORDER — PROPOFOL 10 MG/ML IV EMUL
INTRAVENOUS | Status: DC | PRN
  Administered 2011-09-14: 110 mg via INTRAVENOUS

## 2011-09-14 MED ORDER — GLYCOPYRROLATE 0.2 MG/ML IJ SOLN
INTRAMUSCULAR | Status: DC | PRN
  Administered 2011-09-14: .6 mg via INTRAVENOUS

## 2011-09-14 MED ORDER — ROCURONIUM BROMIDE 100 MG/10ML IV SOLN
INTRAVENOUS | Status: DC | PRN
  Administered 2011-09-14: 35 mg via INTRAVENOUS

## 2011-09-14 MED ORDER — GLYCOPYRROLATE 0.2 MG/ML IJ SOLN
0.1000 mg | Freq: Once | INTRAMUSCULAR | Status: AC
  Administered 2011-09-14: 0.2 mg via INTRAVENOUS

## 2011-09-14 MED ORDER — FENTANYL CITRATE 0.05 MG/ML IJ SOLN
INTRAMUSCULAR | Status: DC | PRN
  Administered 2011-09-14 (×2): 100 ug via INTRAVENOUS
  Administered 2011-09-14: 50 ug via INTRAVENOUS

## 2011-09-14 MED ORDER — VANCOMYCIN HCL 1000 MG IV SOLR
1000.0000 mg | INTRAVENOUS | Status: DC | PRN
  Administered 2011-09-14: 1 g via INTRAVENOUS

## 2011-09-14 MED ORDER — LIDOCAINE HCL (PF) 1 % IJ SOLN
INTRAMUSCULAR | Status: AC
  Filled 2011-09-14: qty 5

## 2011-09-14 MED ORDER — MIDAZOLAM HCL 2 MG/2ML IJ SOLN
INTRAMUSCULAR | Status: AC
  Administered 2011-09-14: 2 mg via INTRAVENOUS
  Filled 2011-09-14: qty 2

## 2011-09-14 MED ORDER — ONDANSETRON HCL 4 MG/2ML IJ SOLN
INTRAMUSCULAR | Status: AC
  Administered 2011-09-14: 4 mg via INTRAVENOUS
  Filled 2011-09-14: qty 2

## 2011-09-14 MED ORDER — ONDANSETRON HCL 4 MG/2ML IJ SOLN
4.0000 mg | Freq: Once | INTRAMUSCULAR | Status: AC
  Administered 2011-09-14: 4 mg via INTRAVENOUS

## 2011-09-14 MED ORDER — VANCOMYCIN HCL IN DEXTROSE 1-5 GM/200ML-% IV SOLN
INTRAVENOUS | Status: AC
  Filled 2011-09-14: qty 200

## 2011-09-14 MED ORDER — GLYCOPYRROLATE 0.2 MG/ML IJ SOLN
0.2000 mg | Freq: Once | INTRAMUSCULAR | Status: AC | PRN
  Administered 2011-09-14: 0.2 mg via INTRAVENOUS

## 2011-09-14 MED ORDER — ONDANSETRON HCL 4 MG/2ML IJ SOLN: 4.0000 mg | Freq: Once | INTRAMUSCULAR | Status: DC | PRN

## 2011-09-14 MED ORDER — FENTANYL CITRATE 0.05 MG/ML IJ SOLN
INTRAMUSCULAR | Status: AC
  Administered 2011-09-14: 25 ug via INTRAVENOUS
  Filled 2011-09-14: qty 5

## 2011-09-14 MED ORDER — HYDROCODONE-ACETAMINOPHEN 7.5-500 MG PO TABS: 1.0000 | ORAL_TABLET | Freq: Four times a day (QID) | ORAL | Status: AC | PRN

## 2011-09-14 MED ORDER — MIDAZOLAM HCL 2 MG/2ML IJ SOLN
1.0000 mg | INTRAMUSCULAR | Status: DC | PRN
  Administered 2011-09-14: 2 mg via INTRAVENOUS

## 2011-09-14 MED ORDER — BUPIVACAINE-EPINEPHRINE PF 0.5-1:200000 % IJ SOLN
INTRAMUSCULAR | Status: AC
  Filled 2011-09-14: qty 20

## 2011-09-14 MED ORDER — PROMETHAZINE HCL 12.5 MG PO TABS: 12.5000 mg | ORAL_TABLET | Freq: Four times a day (QID) | ORAL | Status: AC | PRN

## 2011-09-14 MED ORDER — NEOSTIGMINE METHYLSULFATE 1 MG/ML IJ SOLN
INTRAMUSCULAR | Status: DC | PRN
  Administered 2011-09-14: 4 mg via INTRAVENOUS

## 2011-09-14 MED ORDER — LACTATED RINGERS IV SOLN
INTRAVENOUS | Status: DC
  Administered 2011-09-14: 09:00:00 via INTRAVENOUS

## 2011-09-14 MED ORDER — IBUPROFEN 800 MG PO TABS: 800.0000 mg | ORAL_TABLET | Freq: Three times a day (TID) | ORAL | Status: AC | PRN

## 2011-09-14 MED ORDER — ACETAMINOPHEN 325 MG PO TABS: 325.0000 mg | ORAL_TABLET | ORAL | Status: DC | PRN

## 2011-09-14 MED ORDER — GLYCOPYRROLATE 0.2 MG/ML IJ SOLN
INTRAMUSCULAR | Status: AC
  Administered 2011-09-14: 0.2 mg via INTRAVENOUS
  Filled 2011-09-14: qty 2

## 2011-09-14 SURGICAL SUPPLY — 77 items
BIT DRILL 2.0X128 (BIT) ×2 IMPLANT
BLADE HEX COATED 2.75 (ELECTRODE) ×2 IMPLANT
BLADE OSC/SAGITTAL MD 9X18.5 (BLADE) ×2 IMPLANT
BLADE SURG 15 STRL LF DISP TIS (BLADE) ×1 IMPLANT
BLADE SURG 15 STRL SS (BLADE) ×1
BLADE SURG SZ10 CARB STEEL (BLADE) ×2 IMPLANT
BNDG COHESIVE 4X5 TAN NS LF (GAUZE/BANDAGES/DRESSINGS) ×2 IMPLANT
BUR FAST CUTTING (BURR)
BUR ROUND 5.0 (BURR) IMPLANT
BUR SRG 54X4.7X12 FLUT (BURR) IMPLANT
BURR SRG 54X4.7X12 FLUT (BURR)
CATH KIT ON Q 2.5IN SLV (PAIN MANAGEMENT) IMPLANT
CHLORAPREP W/TINT 26ML (MISCELLANEOUS) ×2 IMPLANT
CLOTH BEACON ORANGE TIMEOUT ST (SAFETY) ×2 IMPLANT
COOLER CRYO CUFF IC AND MOTOR (MISCELLANEOUS) ×2 IMPLANT
COVER LIGHT HANDLE STERIS (MISCELLANEOUS) ×4 IMPLANT
COVER MAYO STAND XLG (DRAPE) ×2 IMPLANT
COVER PROBE W GEL 5X96 (DRAPES) ×2 IMPLANT
CUFF CRYO UNI SHDR 32X48 (MISCELLANEOUS) ×2 IMPLANT
DECANTER SPIKE VIAL GLASS SM (MISCELLANEOUS) IMPLANT
DRAPE ORTHO 2.5IN SPLIT 77X108 (DRAPES) ×2 IMPLANT
DRAPE ORTHO SPLIT 77X108 STRL (DRAPES) ×2
DRAPE PROXIMA HALF (DRAPES) ×2 IMPLANT
DRAPE U-SHAPE 47X51 STRL (DRAPES) ×2 IMPLANT
DRESSING ALLEVYN BORDER HEEL (GAUZE/BANDAGES/DRESSINGS) ×2 IMPLANT
ELECT REM PT RETURN 9FT ADLT (ELECTROSURGICAL) ×2
ELECTRODE REM PT RTRN 9FT ADLT (ELECTROSURGICAL) ×1 IMPLANT
FORMALIN 10 PREFIL 480ML (MISCELLANEOUS) IMPLANT
GLOVE BIOGEL PI IND STRL 7.0 (GLOVE) ×2 IMPLANT
GLOVE BIOGEL PI IND STRL 8.5 (GLOVE) ×1 IMPLANT
GLOVE BIOGEL PI INDICATOR 7.0 (GLOVE) ×2
GLOVE BIOGEL PI INDICATOR 8.5 (GLOVE) ×1
GLOVE ECLIPSE 6.5 STRL STRAW (GLOVE) ×4 IMPLANT
GLOVE ECLIPSE 8.0 STRL XLNG CF (GLOVE) ×6 IMPLANT
GLOVE INDICATOR 7.0 STRL GRN (GLOVE) ×2 IMPLANT
GLOVE INDICATOR 8.5 STRL (GLOVE) ×4 IMPLANT
GLOVE SKINSENSE NS SZ8.0 LF (GLOVE) ×1
GLOVE SKINSENSE STRL SZ8.0 LF (GLOVE) ×1 IMPLANT
GLOVE SS N UNI LF 8.5 STRL (GLOVE) ×2 IMPLANT
GOWN BRE IMP SLV AUR XL STRL (GOWN DISPOSABLE) ×4 IMPLANT
GOWN STRL REIN 3XL LVL4 (GOWN DISPOSABLE) ×4 IMPLANT
GOWN STRL REIN XL XLG (GOWN DISPOSABLE) ×2 IMPLANT
INST SET MINOR BONE (KITS) ×2 IMPLANT
KIT BLADEGUARD II DBL (SET/KITS/TRAYS/PACK) ×2 IMPLANT
KIT ROOM TURNOVER APOR (KITS) ×2 IMPLANT
MANIFOLD NEPTUNE II (INSTRUMENTS) ×2 IMPLANT
MARKER SKIN DUAL TIP RULER LAB (MISCELLANEOUS) ×2 IMPLANT
NEEDLE HYPO 18GX1.5 BLUNT FILL (NEEDLE) ×2 IMPLANT
NEEDLE HYPO 21X1.5 SAFETY (NEEDLE) ×2 IMPLANT
NEEDLE MA TROC 1/2 (NEEDLE) ×2 IMPLANT
NEEDLE MAYO 6 CRC TAPER PT (NEEDLE) IMPLANT
NS IRRIG 1000ML POUR BTL (IV SOLUTION) ×2 IMPLANT
PACK BASIC III (CUSTOM PROCEDURE TRAY) ×1
PACK SRG BSC III STRL LF ECLPS (CUSTOM PROCEDURE TRAY) ×1 IMPLANT
PAD ARMBOARD 7.5X6 YLW CONV (MISCELLANEOUS) ×2 IMPLANT
PASSER SUT CAPTURE FIRST (SUTURE) IMPLANT
PENCIL HANDSWITCHING (ELECTRODE) ×2 IMPLANT
RASP SM TEAR CROSS CUT (RASP) ×2 IMPLANT
SET BASIN LINEN APH (SET/KITS/TRAYS/PACK) ×2 IMPLANT
SLING ARM FOAM STRAP MED (SOFTGOODS) ×2 IMPLANT
SPONGE GAUZE 4X4 12PLY (GAUZE/BANDAGES/DRESSINGS) IMPLANT
SPONGE LAP 18X18 X RAY DECT (DISPOSABLE) ×2 IMPLANT
SPONGE LAP 4X18 X RAY DECT (DISPOSABLE) ×2 IMPLANT
STAPLER VISISTAT 35W (STAPLE) ×2 IMPLANT
STOCKINETTE IMPERVIOUS LG (DRAPES) ×2 IMPLANT
STRIP CLOSURE SKIN 1/2X4 (GAUZE/BANDAGES/DRESSINGS) IMPLANT
SUT BONE WAX W31G (SUTURE) IMPLANT
SUT BRALON NAB BRD #1 30IN (SUTURE) IMPLANT
SUT ETHIBOND 5 LR DA (SUTURE) IMPLANT
SUT ETHIBOND NAB OS 4 #2 30IN (SUTURE) ×2 IMPLANT
SUT MON AB 0 CT1 (SUTURE) ×2 IMPLANT
SUT MON AB 2-0 CT1 36 (SUTURE) IMPLANT
SUT PROLENE 3 0 PS 1 (SUTURE) IMPLANT
SYR 30ML LL (SYRINGE) ×2 IMPLANT
SYR BULB IRRIGATION 50ML (SYRINGE) ×4 IMPLANT
TOWEL OR 17X26 4PK STRL BLUE (TOWEL DISPOSABLE) ×2 IMPLANT
YANKAUER SUCT 12FT TUBE ARGYLE (SUCTIONS) ×2 IMPLANT

## 2011-09-14 NOTE — Anesthesia Procedure Notes (Addendum)
Procedure Name: Intubation Date/Time: 09/14/2011 11:17 AM Performed by: Carolyne Littles, Rigel Filsinger Pre-anesthesia Checklist: Patient identified, Emergency Drugs available, Suction available, Patient being monitored and Timeout performed Patient Re-evaluated:Patient Re-evaluated prior to inductionOxygen Delivery Method: Circle System Utilized Preoxygenation: Pre-oxygenation with 100% oxygen Intubation Type: IV induction Ventilation: Mask ventilation without difficulty Laryngoscope Size: Miller and 3 Grade View: Grade II Tube type: Oral Tube size: 7.0 mm Number of attempts: 1 Placement Confirmation: ETT inserted through vocal cords under direct vision,  positive ETCO2 and breath sounds checked- equal and bilateral Secured at: 22 cm Tube secured with: Tape Dental Injury: Teeth and Oropharynx as per pre-operative assessment    Performed by: Carolyne Littles, Danilo Cappiello

## 2011-09-14 NOTE — Progress Notes (Signed)
Dr Tollie Eth notified about pt's frequent non-productive cough. Med ordered and given.

## 2011-09-14 NOTE — Anesthesia Preprocedure Evaluation (Signed)
Anesthesia Evaluation  Name, MR# and DOB Patient awake  General Assessment Comment  Reviewed: Allergy & Precautions, H&P , NPO status , Patient's Chart, lab work & pertinent test results  History of Anesthesia Complications Negative for: history of anesthetic complications  Airway Mallampati: I TM Distance: >3 FB Neck ROM: Full    Dental  (+) Upper Dentures   Pulmonary    Pulmonary exam normal       Cardiovascular Regular Normal    Neuro/Psych PSYCHIATRIC DISORDERS Anxiety Depression  Neuromuscular disease    GI/Hepatic Neg liver ROS  hiatal hernia   Endo/Other  Negative Endocrine ROS  Renal/GU negative Renal ROS     Musculoskeletal  (+) Fibromyalgia -  Abdominal Normal abdominal exam  (+)   Peds  Hematology  (+) Blood dyscrasia, anemia ,   Anesthesia Other Findings   Reproductive/Obstetrics negative OB ROS                           Anesthesia Physical Anesthesia Plan  ASA: II  Anesthesia Plan: General   Post-op Pain Management:    Induction: Intravenous  Airway Management Planned: Oral ETT  Additional Equipment:   Intra-op Plan:   Post-operative Plan: Extubation in OR  Informed Consent: I have reviewed the patients History and Physical, chart, labs and discussed the procedure including the risks, benefits and alternatives for the proposed anesthesia with the patient or authorized representative who has indicated his/her understanding and acceptance.     Plan Discussed with: CRNA  Anesthesia Plan Comments:         Anesthesia Quick Evaluation

## 2011-09-14 NOTE — Op Note (Signed)
Operative procedure report  Date of surgery 09/14/2011  Preop diagnosis torn rotator cuff, partial tear bursal side right shoulder  Postop diagnosis impingement syndrome right shoulder  Procedure open acromioplasty  Surgeon Verlin Uher assisted by Ron Harris  Anesthesia Gen.  Operative findings acromial spur abrasion superficial surface supraspinatus, intact rotator cuff  The patient is identified in the preop area the right shoulder was marked as a surgical site. The patient was taken to the operating room given 1 g of Ancef secondary to penicillin allergy  She was intubated placed in the beachchair position with a sandbag under the right shoulder. After timeout procedure an incision was made starting at the anterolateral edge of the acromion and taking it towards the coracoid the subcutaneous tissue was divided after blunt retractors were placed the deltoid was split in line with its fibers and retracted with deep retractors. A portion of the deltoid was peeled from the acromion.  Bursectomy was then performed  The cuff was then inspected and found to be intact. Fluid was injected beneath the cuff and there was no leak.  An acromioplasty was then performed with standard technique using a saw and a rasp  After thorough wound irrigation a drill hole was placed in the acromion and #2 Ethibond suture was used to repair the deltoid. Additional interrupted #2 Ethibond sutures were used to repair the deltoid split  0 Monocryl suture was used to close subcutaneous tissue  Staples were used to close the skin the patient was placed in a sling. Patient was then taken recovery in stable condition.       

## 2011-09-14 NOTE — Transfer of Care (Signed)
Immediate Anesthesia Transfer of Care Note  Patient: Krystal Carpenter  Procedure(s) Performed:  SHOULDER ACROMIOPLASTY  Patient Location: PACU  Anesthesia Type: General  Level of Consciousness: awake and patient cooperative  Airway & Oxygen Therapy: Patient Spontanous Breathing and Patient connected to face mask oxygen  Post-op Assessment: Report given to PACU RN and Post -op Vital signs reviewed and stable  Post vital signs: stable  Complications: No apparent anesthesia complications

## 2011-09-14 NOTE — Interval H&P Note (Signed)
History and Physical Interval Note:   09/14/2011   11:02 AM   Krystal Carpenter  has presented today for surgery, with the diagnosis of torn rotator cuff right shoulder  The various methods of treatment have been discussed with the patient and family. After consideration of risks, benefits and other options for treatment, the patient has consented to  Procedure(s):right  ROTATOR CUFF REPAIR SHOULDER OPEN as a surgical intervention .  I have reviewed the patients' chart and labs.  Questions were answered to the patient's satisfaction.    H&P guidelines Update  The H/P was reviewed, the patient was examined, and there is no change in the patient's condition since the original H/P was completed.  Per Joint commission requirements   Terrance Mass., MD   Fuller Canada  MD

## 2011-09-14 NOTE — Brief Op Note (Signed)
09/14/2011  12:28 PM  PATIENT:  Krystal Carpenter  70 y.o. female  PRE-OPERATIVE DIAGNOSIS:  torn rotator cuff right shoulder  POST-OPERATIVE DIAGNOSIS:  impingement syndrome right shoulder  PROCEDURE:  Procedure(s):RIGHT OPEN SHOULDER ACROMIOPLASTY  SURGEON:  Surgeon(s): Fuller Canada, MD  PHYSICIAN ASSISTANT:   ASSISTANTS: RON HARRIS   ANESTHESIA:   general  OR FLUID I/O:  Total I/O In: 500 [I.V.:500] Out: -   BLOOD ADMINISTERED:none  DRAINS: none   LOCAL MEDICATIONS USED:  MARCAINE W/ EPI 60CC  SPECIMEN:  No Specimen  DISPOSITION OF SPECIMEN:  N/A  COUNTS:  YES  TOURNIQUET:  * No tourniquets in log *  DICTATION: .Dragon Dictation  PLAN OF CARE: Discharge to home after PACU  PATIENT DISPOSITION:  PACU - hemodynamically stable.   Delay start of Pharmacological VTE agent (>24hrs) due to surgical blood loss or risk of bleeding:  not applicable

## 2011-09-14 NOTE — Anesthesia Postprocedure Evaluation (Signed)
  Anesthesia Post-op Note  Patient: Krystal Carpenter  Procedure(s) Performed:  SHOULDER ACROMIOPLASTY  Patient Location: PACU  Anesthesia Type: General  Level of Consciousness: awake, alert  and patient cooperative  Airway and Oxygen Therapy: Patient Spontanous Breathing and Patient connected to face mask oxygen  Post-op Pain: mild  Post-op Assessment: Post-op Vital signs reviewed, Patient's Cardiovascular Status Stable, Respiratory Function Stable, Patent Airway, No signs of Nausea or vomiting and Pain level controlled  Post-op Vital Signs: stable  Complications: No apparent anesthesia complications

## 2011-09-17 ENCOUNTER — Encounter: Payer: Self-pay | Admitting: Orthopedic Surgery

## 2011-09-17 ENCOUNTER — Ambulatory Visit (INDEPENDENT_AMBULATORY_CARE_PROVIDER_SITE_OTHER): Payer: Medicare Other | Admitting: Orthopedic Surgery

## 2011-09-17 VITALS — Ht 63.0 in | Wt 125.0 lb

## 2011-09-17 DIAGNOSIS — M75101 Unspecified rotator cuff tear or rupture of right shoulder, not specified as traumatic: Secondary | ICD-10-CM

## 2011-09-17 DIAGNOSIS — M67919 Unspecified disorder of synovium and tendon, unspecified shoulder: Secondary | ICD-10-CM

## 2011-09-17 NOTE — Patient Instructions (Signed)
Sling x 3 weeks 

## 2011-09-17 NOTE — Progress Notes (Signed)
Postop visit #1  Date of surgery October 5  Impingement syndrome RIGHT shoulder no evidence of rotator cuff tear  Procedure acromioplasty open  Clean incision  Return for staples to be removed  Start therapy at postop week #3

## 2011-09-17 NOTE — Op Note (Signed)
Operative procedure report  Date of surgery 09/14/2011  Preop diagnosis torn rotator cuff, partial tear bursal side right shoulder  Postop diagnosis impingement syndrome right shoulder  Procedure open acromioplasty  Surgeon Romeo Apple assisted by Lucrezia Europe  Anesthesia Gen.  Operative findings acromial spur abrasion superficial surface supraspinatus, intact rotator cuff  The patient is identified in the preop area the right shoulder was marked as a surgical site. The patient was taken to the operating room given 1 g of Ancef secondary to penicillin allergy  She was intubated placed in the beachchair position with a sandbag under the right shoulder. After timeout procedure an incision was made starting at the anterolateral edge of the acromion and taking it towards the coracoid the subcutaneous tissue was divided after blunt retractors were placed the deltoid was split in line with its fibers and retracted with deep retractors. A portion of the deltoid was peeled from the acromion.  Bursectomy was then performed  The cuff was then inspected and found to be intact. Fluid was injected beneath the cuff and there was no leak.  An acromioplasty was then performed with standard technique using a saw and a rasp  After thorough wound irrigation a drill hole was placed in the acromion and #2 Ethibond suture was used to repair the deltoid. Additional interrupted #2 Ethibond sutures were used to repair the deltoid split  0 Monocryl suture was used to close subcutaneous tissue  Staples were used to close the skin the patient was placed in a sling. Patient was then taken recovery in stable condition.

## 2011-09-25 ENCOUNTER — Encounter: Payer: Self-pay | Admitting: Orthopedic Surgery

## 2011-09-25 ENCOUNTER — Ambulatory Visit (INDEPENDENT_AMBULATORY_CARE_PROVIDER_SITE_OTHER): Payer: Medicare Other | Admitting: Orthopedic Surgery

## 2011-09-25 VITALS — BP 122/72 | Ht 63.5 in | Wt 125.0 lb

## 2011-09-25 DIAGNOSIS — M75101 Unspecified rotator cuff tear or rupture of right shoulder, not specified as traumatic: Secondary | ICD-10-CM

## 2011-09-25 DIAGNOSIS — M67919 Unspecified disorder of synovium and tendon, unspecified shoulder: Secondary | ICD-10-CM

## 2011-09-25 NOTE — Patient Instructions (Signed)
Start PT 

## 2011-09-25 NOTE — Progress Notes (Signed)
Postop visit # 2  Date of surgery October 5  Impingement syndrome RIGHT shoulder no evidence of rotator cuff tear  Procedure acromioplasty open  Removal of staples. Incision clean   Start physical therapy.  Returns for 3rd postop visit

## 2011-10-23 ENCOUNTER — Ambulatory Visit (INDEPENDENT_AMBULATORY_CARE_PROVIDER_SITE_OTHER): Payer: Medicare Other | Admitting: Orthopedic Surgery

## 2011-10-23 ENCOUNTER — Encounter: Payer: Self-pay | Admitting: Orthopedic Surgery

## 2011-10-23 VITALS — BP 122/78 | Ht 63.5 in | Wt 125.0 lb

## 2011-10-23 DIAGNOSIS — Z9889 Other specified postprocedural states: Secondary | ICD-10-CM

## 2011-10-23 NOTE — Patient Instructions (Signed)
FINISH PHYSICAL THERAPY

## 2011-10-23 NOTE — Progress Notes (Signed)
Postop visit # 3  Date of surgery October 5  Impingement syndrome RIGHT shoulder no evidence of rotator cuff tear  Procedure OPEN acromioplasty    She still continues to have pain running down her arm into her RIGHT hand.  I her that during her open acromioplasty that her rotator cuff is fine and remove the bone spurs that were potentially causing shoulder pain and that the pain in her shoulder should improve, although I do not expect her arm pain to improve.  She was seen a neurosurgeon regarding that later in December. We will try to contact him to get her a Her appointment

## 2011-11-25 ENCOUNTER — Emergency Department (HOSPITAL_COMMUNITY): Payer: Medicare Other

## 2011-11-25 ENCOUNTER — Emergency Department (HOSPITAL_COMMUNITY)
Admission: EM | Admit: 2011-11-25 | Discharge: 2011-11-25 | Disposition: A | Discharge status: Home / Self Care | Payer: Medicare Other | Attending: Emergency Medicine | Admitting: Emergency Medicine

## 2011-11-25 ENCOUNTER — Other Ambulatory Visit: Payer: Self-pay

## 2011-11-25 ENCOUNTER — Encounter (HOSPITAL_COMMUNITY): Payer: Self-pay | Admitting: Emergency Medicine

## 2011-11-25 DIAGNOSIS — M4120 Other idiopathic scoliosis, site unspecified: Secondary | ICD-10-CM | POA: Insufficient documentation

## 2011-11-25 DIAGNOSIS — K589 Irritable bowel syndrome without diarrhea: Secondary | ICD-10-CM | POA: Insufficient documentation

## 2011-11-25 DIAGNOSIS — B029 Zoster without complications: Secondary | ICD-10-CM | POA: Insufficient documentation

## 2011-11-25 DIAGNOSIS — F329 Major depressive disorder, single episode, unspecified: Secondary | ICD-10-CM | POA: Insufficient documentation

## 2011-11-25 DIAGNOSIS — K449 Diaphragmatic hernia without obstruction or gangrene: Secondary | ICD-10-CM | POA: Insufficient documentation

## 2011-11-25 DIAGNOSIS — IMO0001 Reserved for inherently not codable concepts without codable children: Secondary | ICD-10-CM | POA: Insufficient documentation

## 2011-11-25 DIAGNOSIS — F3289 Other specified depressive episodes: Secondary | ICD-10-CM | POA: Insufficient documentation

## 2011-11-25 DIAGNOSIS — R109 Unspecified abdominal pain: Secondary | ICD-10-CM | POA: Insufficient documentation

## 2011-11-25 DIAGNOSIS — R079 Chest pain, unspecified: Secondary | ICD-10-CM | POA: Insufficient documentation

## 2011-11-25 DIAGNOSIS — F411 Generalized anxiety disorder: Secondary | ICD-10-CM | POA: Insufficient documentation

## 2011-11-25 LAB — URINALYSIS, ROUTINE W REFLEX MICROSCOPIC
Bilirubin Urine: NEGATIVE
Glucose, UA: NEGATIVE mg/dL
Ketones, ur: NEGATIVE mg/dL
Leukocytes, UA: NEGATIVE
Protein, ur: NEGATIVE mg/dL

## 2011-11-25 LAB — BASIC METABOLIC PANEL
BUN: 11 mg/dL (ref 6–23)
CO2: 30 mEq/L (ref 19–32)
Calcium: 9.4 mg/dL (ref 8.4–10.5)
Creatinine, Ser: 0.8 mg/dL (ref 0.50–1.10)
Glucose, Bld: 107 mg/dL — ABNORMAL HIGH (ref 70–99)

## 2011-11-25 LAB — POCT I-STAT TROPONIN I: Troponin i, poc: 0 ng/mL (ref 0.00–0.08)

## 2011-11-25 LAB — CBC
MCH: 32.5 pg (ref 26.0–34.0)
MCHC: 33.6 g/dL (ref 30.0–36.0)
MCV: 96.7 fL (ref 78.0–100.0)
Platelets: 234 10*3/uL (ref 150–400)
RDW: 12.7 % (ref 11.5–15.5)

## 2011-11-25 LAB — TROPONIN I: Troponin I: <0.3 ng/mL (ref <0.30)

## 2011-11-25 MED ORDER — SODIUM CHLORIDE 0.9 % IV BOLUS (SEPSIS)
1000.0000 mL | Freq: Once | INTRAVENOUS | Status: AC
  Administered 2011-11-25: 1000 mL via INTRAVENOUS

## 2011-11-25 MED ORDER — OXYCODONE-ACETAMINOPHEN 5-325 MG PO TABS: 2.0000 | ORAL_TABLET | Freq: Four times a day (QID) | ORAL | Status: AC | PRN

## 2011-11-25 MED ORDER — SODIUM CHLORIDE 0.9 % IV SOLN
1000.0000 mL | INTRAVENOUS | Status: AC
  Administered 2011-11-25: 1000 mL via INTRAVENOUS

## 2011-11-25 MED ORDER — SODIUM CHLORIDE 0.9 % IV SOLN: INTRAVENOUS | Status: DC

## 2011-11-25 MED ORDER — MORPHINE SULFATE 4 MG/ML IJ SOLN
4.0000 mg | Freq: Once | INTRAMUSCULAR | Status: AC
  Administered 2011-11-25: 4 mg via INTRAVENOUS
  Filled 2011-11-25: qty 1

## 2011-11-25 MED ORDER — VALACYCLOVIR HCL 1 G PO TABS: 1000.0000 mg | ORAL_TABLET | Freq: Three times a day (TID) | ORAL | Status: AC

## 2011-11-25 MED ORDER — KETOROLAC TROMETHAMINE 30 MG/ML IJ SOLN
30.0000 mg | Freq: Once | INTRAMUSCULAR | Status: AC
  Administered 2011-11-25: 30 mg via INTRAVENOUS
  Filled 2011-11-25: qty 1

## 2011-11-25 MED ORDER — SODIUM CHLORIDE 0.9 % IV BOLUS (SEPSIS): 500.0000 mL | Freq: Once | INTRAVENOUS | Status: DC

## 2011-11-25 MED ORDER — ONDANSETRON HCL 4 MG/2ML IJ SOLN
4.0000 mg | Freq: Once | INTRAMUSCULAR | Status: AC
  Administered 2011-11-25: 4 mg via INTRAVENOUS
  Filled 2011-11-25: qty 2

## 2011-11-25 NOTE — ED Notes (Signed)
Patient has shingles per EDP. Patient moved to negative pressure room. Different nurse assigned to patient.

## 2011-11-25 NOTE — ED Notes (Signed)
Called to Pt room, c/o chest pain . States feels heavy in center of chest.  Denies N/V diaphoresis, or radiating pain. EDP notified.

## 2011-11-25 NOTE — ED Provider Notes (Signed)
History     CSN: [id number] Arrival date & time: 11/25/2011 11:19 AM   First MD Initiated Contact with Patient 11/25/11 1204      Chief Complaint  Patient presents with  . Flank Pain    (Consider location/radiation/quality/duration/timing/severity/associated sxs/prior treatment) HPI  Past Medical History  Diagnosis Date  . IBS (irritable bowel syndrome)   . Fibromyalgia   . Scoliosis   . Hiatal hernia   . Anxiety   . Depression     Past Surgical History  Procedure Date  . Gallbladder surgery 14 yrs ago    mmh  . Neck surgery     spinal fluis leakage-patched with "pig bladder"  . Foot surgery     bilateral-bunionectomy  . Shoulder surgery     distal clavicle date unknown-right-[place]  . Cholecystectomy 14 yrs ago    mmh    Family History  Problem Relation Age of Onset  . Heart disease    . Lung disease    . Cancer    . Asthma    . Diabetes    . Anesthesia problems Neg Hx   . Hypotension Neg Hx   . Malignant hyperthermia Neg Hx   . Pseudochol deficiency Neg Hx     History  Substance Use Topics  . Smoking status: Never Smoker   . Smokeless tobacco: Not on file  . Alcohol Use: No    OB History    Grav Para Term Preterm Abortions TAB SAB Ect Mult Living                  Review of Systems  Allergies  Penicillins  Home Medications   Current Outpatient Rx  Name Route Sig Dispense Refill  . CEFUROXIME AXETIL 500 MG PO TABS Oral Take 500 mg by mouth 2 (two) times daily. Started on 11-24-11 for 7 days     . ONDANSETRON HCL 8 MG PO TABS Oral Take 8 mg by mouth every 8 (eight) hours as needed. For nausea      . TRAZODONE HCL 50 MG PO TABS Oral Take 50 mg by mouth at bedtime.     . VENLAFAXINE HCL 75 MG PO TABS Oral Take 75 mg by mouth at bedtime.     . OXYCODONE-ACETAMINOPHEN 5-325 MG PO TABS Oral Take 2 tablets by mouth every 6 (six) hours as needed for pain. 20 tablet 0  . VALACYCLOVIR HCL 1 G PO TABS Oral Take 1 tablet (1,000 mg total) by  mouth 3 (three) times daily. 21 tablet 0  . VALACYCLOVIR HCL 1 G PO TABS Oral Take 1 tablet (1,000 mg total) by mouth 3 (three) times daily. For 7 days 21 tablet 0    BP 149/79  Pulse 83  Temp(Src) 99 F (37.2 C) (Oral)  Resp 21  Ht 5\' 3"  (1.6 m)  Wt 123 lb (55.792 kg)  BMI 21.79 kg/m2  SpO2 97%  Physical Exam  ED Course  Procedures (including critical care time)  Labs Reviewed  URINALYSIS, ROUTINE W REFLEX MICROSCOPIC - Abnormal; Notable for the following:    Hgb urine dipstick TRACE (*)    All other components within normal limits  BASIC METABOLIC PANEL - Abnormal; Notable for the following:    Glucose, Bld 107 (*)    GFR calc non Af Amer 73 (*)    GFR calc Af Amer 85 (*)    All other components within normal limits  URINE MICROSCOPIC-ADD ON - Abnormal; Notable for the following:  Squamous Epithelial / LPF FEW (*)    All other components within normal limits  CBC  POCT I-STAT TROPONIN I  TROPONIN I  POCT I-STAT TROPONIN I  I-STAT TROPONIN I  I-STAT TROPONIN I   No results found.   1. Zoster   2. Chest pain       MDM  Reexamination at 1845. Shingles on right flank and right lower abdomen examined. Urinalysis shows small amount of both red and white cells. Will obtain CT abdomen and pelvis without contrast to rule out kidney stone. IV Toradol given        Donnetta Hutching, MD 11/25/11 [phone]

## 2011-11-25 NOTE — ED Notes (Signed)
MD at bedside. Dr Adriana Simas at bedside, examining pt and discussing plan of care.

## 2011-11-25 NOTE — ED Notes (Signed)
EDP at bedside examining pt and discussing plan of care. Troponin drew from rt [facility] without difficulty. Pt tolerated well.  Results pending.

## 2011-11-25 NOTE — ED Notes (Signed)
Patient with c/o right flank pain and lower back pain. Patient reports being treated for UTI by Dr Kathie Rhodes. Luking on Tuesday and given antibiotic, worsening symptoms Friday and Dr Gerda Diss put her on another antibiotic Friday. Patient reports worsening pain and nausea, unable to keep fluids down orally. Prescription for Zofran not working for nausea.

## 2011-11-25 NOTE — ED Provider Notes (Signed)
This 70 year old female several days ago had urinary frequency and was treated with quinolone antibiotic and then switch to several support antibiotic when even though her urinary frequency improved she had persistent right flank pain but now has broken out in a classic localized zoster rash on the right lower torso distribution without suspicion of disseminated zoster at this time.  I saw and evaluated the patient, reviewed the resident's note and I agree with the findings and plan.  1415 within one hour of receiving morphine the patient developed nausea generalized weakness feeling and had a 10 minute spell of vague chest discomfort that was nonpleuritic and nonexertional she was resting in the bed. Her chest was nontender abdomen is nontender, EKG will be ordered as well as serial troponin levels although I suspect this not due to acute coronary syndrome and is more likely a side effect of the morphine without obvious allergic reaction including no shortness of breath no rash and no tongue or lip swelling.  ECG: NSR, rate 86, axis normal, RSR' V1, no acute ST or T changes noted, no significant change from [id number]  Hurman Horn, MD 11/25/11 2035

## 2011-11-25 NOTE — ED Provider Notes (Signed)
History     CSN: [id number] Arrival date & time: 11/25/2011 11:19 AM   None     Chief Complaint  Patient presents with  . Flank Pain    (Consider location/radiation/quality/duration/timing/severity/associated sxs/prior treatment) HPI 70 y/o F with history of IBS, Fibromyalgia, Scoliosis, multiple past UTIs presents with R flank / lower back pain and nausea.  She first presented to her PCP Dr. Lilyan Punt 5 days ago due to dysuria, increased urinary frequency, and feeling or pressure when she urinated.  She was diagnosed with a UTI and given ciprofloxacin and a pill to "help with the pressure".  Her dysuria, urinary frequency, and feeling of pressure resolved by the next day.  However, she developed R flank/lower back pain.  She went back to her doctor two days ago and had an antibiotic shot (her daughter thinks it was Rocephin) and blood work done.  When the blood work came back, her antibiotic was switched to cefuroxime.  She has since taken two tablets of this.  She describes the back pain as achy it is currently 10/10.  It radiates around her side and down over her iliac crest.  She has been nauseated for 5 days, and has had decreased PO intake.  She has not vomited.  She last ate a couple saltine crackers this morning.  She has not tried any pills for the pain, but she tried one of her daughter's lidoderm patches, which did not improve the pain significantly.    She also noticed a small blistering rash on her lower R abdomen and lateral abd/flank this morning.  She does not think she ever had chicken pox. She denies a burning character to her pain.  She has had frequent chills but has not taken her temperature.    She has a history of hives from penicillin.    Past Medical History  Diagnosis Date  . IBS (irritable bowel syndrome)   . Fibromyalgia   . Scoliosis   . Hiatal hernia   . Anxiety   . Depression     Past Surgical History  Procedure Date  . Gallbladder surgery 14  yrs ago    mmh  . Neck surgery     spinal fluis leakage-patched with "pig bladder"  . Foot surgery     bilateral-bunionectomy  . Shoulder surgery     distal clavicle date unknown-right-[place]  . Cholecystectomy 14 yrs ago    mmh    Family History  Problem Relation Age of Onset  . Heart disease    . Lung disease    . Cancer    . Asthma    . Diabetes    . Anesthesia problems Neg Hx   . Hypotension Neg Hx   . Malignant hyperthermia Neg Hx   . Pseudochol deficiency Neg Hx     History  Substance Use Topics  . Smoking status: Never Smoker   . Smokeless tobacco: Not on file  . Alcohol Use: No     Review of Systems  No CP, SOB, headache, numbness or tingling anywhere.   No constipation of diarrhea. Urinary symptoms/resolution as per HPI.   Allergies  Penicillins  Home Medications   Current Outpatient Rx  Name Route Sig Dispense Refill  . CEFUROXIME AXETIL 500 MG PO TABS Oral Take 500 mg by mouth 2 (two) times daily. Started on 11-24-11 for 7 days     . ONDANSETRON HCL 8 MG PO TABS Oral Take 8 mg by mouth every 8 (eight) hours  as needed. For nausea      . TRAZODONE HCL 50 MG PO TABS Oral Take 50 mg by mouth at bedtime.     . VENLAFAXINE HCL 75 MG PO TABS Oral Take 75 mg by mouth at bedtime.       BP 153/81  Pulse 82  Temp(Src) 99 F (37.2 C) (Oral)  Resp 18  Ht 5\' 3"  (1.6 m)  Wt 123 lb (55.792 kg)  BMI 21.79 kg/m2  SpO2 97%  Physical Exam  General: alert, well-developed, and cooperative to examination.  In mild distress from pain. Head: normocephalic and atraumatic.  Eyes: vision grossly intact, pupils equal, pupils round, pupils reactive to light, no injection and anicteric.  Mouth: pharynx pink and moist, no erythema, and no exudates.  Neck: no JVD. Lungs: normal respiratory effort, no accessory muscle use, normal breath sounds, no crackles, and no wheezes. Heart: normal rate, regular rhythm, no murmur, no gallop, and no rub.   Abdomen:  Tender  to palpation laterally on R central abdomen.  Otherwise s/nt/nd.  Back:R flank tenderness.  No tenderness on left.  No tenderness in R buttocks or R thoracic back.  Spinal curvature is abnormal.  Msk: no joint swelling, no joint warmth, and no redness over joints.   Neurologic: alert & oriented X3, cranial nerves II-XII intact, strength normal in all extremities.  Skin: Small blistering rash from R lower abdomen to R flank wrapping around laterally, three distinct patches.       ED Course  Procedures (including critical care time)  Labs Reviewed  URINALYSIS, ROUTINE W REFLEX MICROSCOPIC - Abnormal; Notable for the following:    Hgb urine dipstick TRACE (*)    All other components within normal limits  BASIC METABOLIC PANEL - Abnormal; Notable for the following:    Glucose, Bld 107 (*)    GFR calc non Af Amer 73 (*)    GFR calc Af Amer 85 (*)    All other components within normal limits  URINE MICROSCOPIC-ADD ON - Abnormal; Notable for the following:    Squamous Epithelial / LPF FEW (*)    All other components within normal limits  CBC   Admission on 11/25/2011  Component Date Value Range Status  . Color, Urine  11/25/2011 YELLOW  YELLOW Final  . APPearance  11/25/2011 CLEAR  CLEAR Final  . Specific Gravity, Urine  11/25/2011 1.020  1.005-1.030 Final  . pH  11/25/2011 7.0  5.0-8.0 Final  . Glucose, UA (mg/dL) 16/09/9603 NEGATIVE  NEGATIVE Final  . Hgb urine dipstick  11/25/2011 TRACE* NEGATIVE Final  . Bilirubin Urine  11/25/2011 NEGATIVE  NEGATIVE Final  . Ketones, ur (mg/dL) 54/08/8118 NEGATIVE  NEGATIVE Final  . Protein, ur (mg/dL) 14/78/2956 NEGATIVE  NEGATIVE Final  . Urobilinogen, UA (mg/dL) 21/30/8657 0.2  8.4-6.9 Final  . Nitrite  11/25/2011 NEGATIVE  NEGATIVE Final  . Leukocytes, UA  11/25/2011 NEGATIVE  NEGATIVE Final  . Sodium (mEq/L) 11/25/2011 138  135-145 Final  . Potassium (mEq/L) 11/25/2011 4.1  3.5-5.1 Final  . Chloride (mEq/L) 11/25/2011 101  96-112  Final  . CO2 (mEq/L) 11/25/2011 30  19-32 Final  . Glucose, Bld (mg/dL) 62/95/2841 324* 40-10 Final  . BUN (mg/dL) 27/25/3664 11  4-03 Final  . Creatinine, Ser (mg/dL) 47/42/5956 3.87  5.64-3.32 Final  . Calcium (mg/dL) 95/18/8416 9.4  6.0-63.0 Final  . GFR calc non Af Amer (mL/min) 11/25/2011 73* >90 Final  . GFR calc Af Amer (mL/min) 11/25/2011 85* >90 Final  Comment:                                 The eGFR has been calculated                          using the CKD EPI equation.                          This calculation has not been                          validated in all clinical                          situations.                          eGFR's persistently                          <90 mL/min signify                          possible Chronic Kidney Disease.  . WBC (K/uL) 11/25/2011 9.4  4.0-10.5 Final  . RBC (MIL/uL) 11/25/2011 4.19  3.87-5.11 Final  . Hemoglobin (g/dL) 57/84/6962 95.2  84.1-32.4 Final  . HCT (%) 11/25/2011 40.5  36.0-46.0 Final  . MCV (fL) 11/25/2011 96.7  78.0-100.0 Final  . MCH (pg) 11/25/2011 32.5  26.0-34.0 Final  . MCHC (g/dL) 40/09/2724 36.6  44.0-34.7 Final  . RDW (%) 11/25/2011 12.7  11.5-15.5 Final  . Platelets (K/uL) 11/25/2011 234  150-400 Final  . Squamous Epithelial / LPF  11/25/2011 FEW* RARE Final  . WBC, UA (WBC/hpf) 11/25/2011 3-6  <3 Final  . RBC / HPF (RBC/hpf) 11/25/2011 7-10  <3 Final  . Bacteria, UA  11/25/2011 RARE  RARE Final  ]  Key results: no leukocytosis, nearly normal renal function, UA shows UTI is resolved/nearly resolved.  10 minute spell of chest pressure/tightness.  Likely adverse reaction to morphine, but cannot be sure its is not cardiac etiology.  Will order POC troponin now, in 3 hrs, and in 6 hrs to rule out cardiac etiology. Troponins need to be done at 14:30, 17:30, 20:30.  EKG done @ 14:22: no significant change from previous  Diagnosis: Shingles   MDM  I discussed this patient with my attending Dr.  Fonnie Jarvis who also interviewed and examined the patient.  It appears that her UTI is resolving or entirely resolved, but she has not developed herpes Zoster (shingles).  Will treat with antiviral medication.  Valtrex 1000mg  PO TID for 7 days  Patient developed some chest pressure after receiving IV morphine and zofran.  Will keep her in ED for observation for 6 hrs to get three sets of cardiac enzymes to rule out ACS.           Blanca Friend, MD 11/25/11 1430

## 2011-12-11 DIAGNOSIS — G588 Other specified mononeuropathies: Secondary | ICD-10-CM

## 2011-12-11 HISTORY — DX: Other specified mononeuropathies: G58.8

## 2012-04-30 ENCOUNTER — Encounter (HOSPITAL_COMMUNITY): Payer: Medicare Other | Attending: Oncology | Admitting: Oncology

## 2012-04-30 ENCOUNTER — Encounter (HOSPITAL_COMMUNITY): Payer: Self-pay | Admitting: Oncology

## 2012-04-30 VITALS — BP 146/86 | HR 78 | Temp 97.2°F | Ht 63.75 in | Wt 110.6 lb

## 2012-04-30 DIAGNOSIS — D649 Anemia, unspecified: Secondary | ICD-10-CM | POA: Insufficient documentation

## 2012-04-30 DIAGNOSIS — Z961 Presence of intraocular lens: Secondary | ICD-10-CM | POA: Insufficient documentation

## 2012-04-30 DIAGNOSIS — R630 Anorexia: Secondary | ICD-10-CM | POA: Insufficient documentation

## 2012-04-30 DIAGNOSIS — R634 Abnormal weight loss: Secondary | ICD-10-CM | POA: Insufficient documentation

## 2012-04-30 DIAGNOSIS — M4120 Other idiopathic scoliosis, site unspecified: Secondary | ICD-10-CM | POA: Insufficient documentation

## 2012-04-30 LAB — DIFFERENTIAL
Basophils Absolute: 0 10*3/uL (ref 0.0–0.1)
Basophils Relative: 0 % (ref 0–1)
Eosinophils Absolute: 0.1 10*3/uL (ref 0.0–0.7)
Eosinophils Relative: 1 % (ref 0–5)

## 2012-04-30 LAB — COMPREHENSIVE METABOLIC PANEL
ALT: 13 U/L (ref 0–35)
BUN: 15 mg/dL (ref 6–23)
CO2: 29 mEq/L (ref 19–32)
Calcium: 9.9 mg/dL (ref 8.4–10.5)
Creatinine, Ser: 0.92 mg/dL (ref 0.50–1.10)
GFR calc Af Amer: 71 mL/min — ABNORMAL LOW (ref >90)
GFR calc non Af Amer: 61 mL/min — ABNORMAL LOW (ref >90)
Glucose, Bld: 93 mg/dL (ref 70–99)

## 2012-04-30 LAB — CBC
MCH: 31 pg (ref 26.0–34.0)
MCV: 94 fL (ref 78.0–100.0)
Platelets: 263 10*3/uL (ref 150–400)
RDW: 13.9 % (ref 11.5–15.5)

## 2012-04-30 LAB — SEDIMENTATION RATE: Sed Rate: 40 mm/hr — ABNORMAL HIGH (ref 0–22)

## 2012-04-30 LAB — RETICULOCYTES: Retic Ct Pct: 0.8 % (ref 0.4–3.1)

## 2012-04-30 NOTE — Progress Notes (Signed)
Addended by: Sterling Big on: 04/30/2012 05:44 PM   Modules accepted: Orders

## 2012-04-30 NOTE — Patient Instructions (Signed)
Krystal Carpenter  [id number] January 05, 1941 Dr. Glenford Peers   [facility]  Discharge Instructions  RECOMMENDATIONS MADE BY THE CONSULTANT AND ANY TEST RESULTS WILL BE SENT TO YOUR REFERRING DOCTOR.   EXAM FINDINGS BY MD TODAY AND SIGNS AND SYMPTOMS TO REPORT TO CLINIC OR PRIMARY MD: exam and discussion per MD.  Need to check some labs and do CT Scan of your chest, abdomen and pelvis.  Concerned about weight loss with history of shingles.  MEDICATIONS PRESCRIBED: none      SPECIAL INSTRUCTIONS/FOLLOW-UP: Lab work Needed today, Xray Studies Needed CT of Chest, Abdomen and Pelvis and Return to Clinic in 1 month.   I acknowledge that I have been informed and understand all the instructions given to me and received a copy. I do not have any more questions at this time, but understand that I may call the Specialty Clinic at [facility] at [phone] during business hours should I have any further questions or need assistance in obtaining follow-up care.    __________________________________________  _____________  __________ Signature of Patient or Authorized Representative            Date                   Time    __________________________________________ Nurse's Signature

## 2012-04-30 NOTE — Progress Notes (Signed)
This office note has been dictated.

## 2012-04-30 NOTE — Progress Notes (Signed)
CC:   Scott A. Gerda Diss, MD Payton Doughty, M.D.  DIAGNOSES: 1. Constellation of issues including greater than 10% weight loss in     the last 5 months, anemia new onset within the last 2-3 months,     history of shingles in December 2012 and anorexia. 2. Scoliosis with chronic back pain but now more recently worsening     left leg sciatic pain. 3. History of right rotator cuff tear. 4. History of bilateral cataract operations, lens implants by Dr.     Delaney Meigs.  HISTORY:  Pleasant 71 year old divorced woman who is accompanied by a good friend who was referred by Dr. Lilyan Punt who has done a very nice workup thus far.  This is a new onset of anemia.  Last year and earlier this year she had a normal hemoglobin, normal white count, normal platelet count, normal differential.  More recently in March, she had a hemoglobin of 10.3, down from over 13 g.  She has had a normal T4, normal TSH, normal BUN and creatinine.  She has had a normal B12 level, normal ferritin level. She does not crave ice.  She likes to eat crushed ice once in a great while but that goes back many years, but she does not crave ice or go out and buy crushed ice.  She does not have fevers, chills or night sweats.  She is not aware of any lumps anywhere.  She has had no change in bowel habits.  No nausea or vomiting.  No headaches, double vision. She is not having any lymph node enlargement.  No blood in her urine. She does not sleep well but that has been present for at least a year.  SOCIAL HISTORY:  She has 3 children in good health, never lost any children, never lost any pregnancies.  She was divorced 20+ years ago. She worked at [employer] system, retired several years ago.  She was in childhood nutrition.  She does not smoke, does not drink.  She also does not use drugs.  PHYSICAL EXAM:  She is alert, oriented but pale.  Weight is 110 pounds, height 5 feet 3-3/4 inches, BMI of 19 which is low.  Her  previous weight was 126 pounds less than 5 months ago.  Blood pressure 146/86 left arm sitting position, pulse 76 and regular, respirations 14 to 16 and unlabored.  She is afebrile.  Lymph nodes:  Negative throughout.  She has no obvious breast masses.  She is a little overdue for mammography, but her breasts are very small and are easily palpated.  Lungs:  Clear. She has obvious scoliosis to the left.  She has spasticity of the left- sided musculature.  Abdomen:  Soft and nontender.  She has no hepatosplenomegaly.  Heart:  Does not reveal murmur, rub or gallop.  She has no peripheral edema.  Pulses in her feet are 1+, symmetrical.  She is right-handed.  No obvious nail changes.  She is alert and oriented. She has an upper dental plate.  Throat unremarkable.  Tongue is unremarkable.  Pupils show changes of prior cataract operations.  So we are left with a lady with a constellation of recent zoster and she went to the emergency room for that on 11/25/2011, then she became anemic, she lost appetite and has lost more than 10% of her body weight. I think she needs to have an occult malignancy ruled out.  We will do a host of other blood tests which I  think need to be done, some of which will be repeats but I think it has been a couple of months since they were done and they should be repeated.  I will tentatively see her in a month, but I suspect we will be in contact as soon as we get the results of her CT scans of the chest, abdomen and pelvis and her blood tests.  If all we see is an anemia that is normocytic, which is what we have so far, I doubt that a bone marrow biopsy will help [place] but we will see what the other tests show first.  I think other tests might include mammography plus or minus colonoscopy, an EGD, to be on the complete side.    ______________________________ Ladona Horns. Mariel Sleet, MD ESN/MEDQ  D:  04/30/2012  T:  04/30/2012  Job:  [id number]

## 2012-04-30 NOTE — Progress Notes (Signed)
Krystal Carpenter presented for labwork. Labs per MD order drawn via Peripheral Line 23 gauge needle inserted in left AC  Good blood return present. Procedure without incident.  Needle removed intact. Patient tolerated procedure well.

## 2012-05-01 LAB — BETA 2 MICROGLOBULIN, SERUM: Beta-2 Microglobulin: 2.21 mg/L — ABNORMAL HIGH (ref 1.01–1.73)

## 2012-05-01 LAB — IRON AND TIBC
Saturation Ratios: 21 % (ref 20–55)
TIBC: 268 ug/dL (ref 250–470)
UIBC: 212 ug/dL (ref 125–400)

## 2012-05-01 LAB — FERRITIN: Ferritin: 80 ng/mL (ref 10–291)

## 2012-05-02 ENCOUNTER — Ambulatory Visit (HOSPITAL_COMMUNITY)
Admission: RE | Admit: 2012-05-02 | Discharge: 2012-05-02 | Disposition: A | Discharge status: Home / Self Care | Payer: Medicare Other | Source: Ambulatory Visit | Attending: Oncology | Admitting: Oncology

## 2012-05-02 DIAGNOSIS — D649 Anemia, unspecified: Secondary | ICD-10-CM | POA: Insufficient documentation

## 2012-05-02 DIAGNOSIS — R634 Abnormal weight loss: Secondary | ICD-10-CM

## 2012-05-02 DIAGNOSIS — R918 Other nonspecific abnormal finding of lung field: Secondary | ICD-10-CM | POA: Insufficient documentation

## 2012-05-02 LAB — PROTEIN ELECTROPHORESIS, SERUM
Beta Globulin: 6.1 % (ref 4.7–7.2)
M-Spike, %: NOT DETECTED g/dL
Total Protein ELP: 6.9 g/dL (ref 6.0–8.3)

## 2012-05-02 MED ORDER — IOHEXOL 300 MG/ML  SOLN
100.0000 mL | Freq: Once | INTRAMUSCULAR | Status: AC | PRN
  Administered 2012-05-02: 100 mL via INTRAVENOUS

## 2012-05-07 ENCOUNTER — Telehealth (HOSPITAL_COMMUNITY): Payer: Self-pay

## 2012-05-07 ENCOUNTER — Other Ambulatory Visit (HOSPITAL_COMMUNITY): Payer: Self-pay

## 2012-05-07 DIAGNOSIS — D649 Anemia, unspecified: Secondary | ICD-10-CM

## 2012-05-07 NOTE — Telephone Encounter (Signed)
Pharmacy is [facility] not [facility].  Rx for Megace called into [facility].

## 2012-05-07 NOTE — Telephone Encounter (Signed)
Patient notified and appointment rescheduled for 7/23.  Per MD, if patient has surgery, she needs to call us for an appointment 2 weeks later and patient aware.

## 2012-05-07 NOTE — Telephone Encounter (Signed)
Message copied by Sterling Big on Wed May 07, 2012  2:37 PM ------      Message from: Mariel Sleet, ERIC S      Created: Wed May 07, 2012  1:24 PM       Start her on Megace 10 cc bid and then repeat CBC diff in 7-8 weeks.  Have her call us in 3 weeks to see if it is helping her wt. And appetite.      ----- Message -----         From: Evelena Leyden, RN         Sent: 05/02/2012   6:46 PM           To: Randall An, MD            Call Dr. Channing Mutters on Tuesday

## 2012-05-07 NOTE — Telephone Encounter (Signed)
Patient notified and prescription called into [facility].

## 2012-05-07 NOTE — Telephone Encounter (Signed)
Message copied by Sterling Big on Wed May 07, 2012  3:39 PM ------      Message from: Mariel Sleet, ERIC S      Created: Wed May 07, 2012  3:22 PM       Yes Dr Channing Mutters knows he can proceed but we need to see her after the megace and some time.      ----- Message -----         From: Evelena Leyden, RN         Sent: 05/07/2012   2:49 PM           To: Randall An, MD            Patient notified.  Wants to know if you have talked with Dr. Channing Mutters?      Also, do we need to reschedule her follow-up visit with you after labs are done in 7 - 8 weeks?  (she's scheduled for 6/24 and lab are scheduled for 7 weeks for 7/17.)      ----- Message -----         From: Randall An, MD         Sent: 05/07/2012   1:24 PM           To: Evelena Leyden, RN            Start her on Megace 10 cc bid and then repeat CBC diff in 7-8 weeks.  Have her call us in 3 weeks to see if it is helping her wt. And appetite.      ----- Message -----         From: Evelena Leyden, RN         Sent: 05/02/2012   6:46 PM           To: Randall An, MD            Call Dr. Channing Mutters on Tuesday

## 2012-05-21 HISTORY — PX: BACK SURGERY: SHX140

## 2012-05-22 ENCOUNTER — Encounter: Payer: Self-pay | Admitting: Oncology

## 2012-06-02 ENCOUNTER — Ambulatory Visit (HOSPITAL_COMMUNITY): Payer: Medicare Other | Admitting: Oncology

## 2012-06-02 ENCOUNTER — Telehealth (HOSPITAL_COMMUNITY): Payer: Self-pay

## 2012-06-02 NOTE — Telephone Encounter (Signed)
Call from patient, has had back surgery and is doing well.  Stopped megace due to constipation but appetite is great.  Just wanted to let Dr. Mariel Sleet know what was going on.

## 2012-06-25 ENCOUNTER — Encounter (HOSPITAL_COMMUNITY): Payer: Medicare Other | Attending: Oncology

## 2012-06-25 DIAGNOSIS — F329 Major depressive disorder, single episode, unspecified: Secondary | ICD-10-CM | POA: Insufficient documentation

## 2012-06-25 DIAGNOSIS — R630 Anorexia: Secondary | ICD-10-CM | POA: Insufficient documentation

## 2012-06-25 DIAGNOSIS — F3289 Other specified depressive episodes: Secondary | ICD-10-CM | POA: Insufficient documentation

## 2012-06-25 DIAGNOSIS — D649 Anemia, unspecified: Secondary | ICD-10-CM | POA: Insufficient documentation

## 2012-06-25 DIAGNOSIS — K5900 Constipation, unspecified: Secondary | ICD-10-CM | POA: Insufficient documentation

## 2012-06-25 DIAGNOSIS — M4120 Other idiopathic scoliosis, site unspecified: Secondary | ICD-10-CM | POA: Insufficient documentation

## 2012-06-25 LAB — CBC
HCT: 31.9 % — ABNORMAL LOW (ref 36.0–46.0)
MCH: 30.8 pg (ref 26.0–34.0)
MCV: 95.5 fL (ref 78.0–100.0)
RDW: 14.7 % (ref 11.5–15.5)
WBC: 7.6 10*3/uL (ref 4.0–10.5)

## 2012-06-25 LAB — DIFFERENTIAL
Basophils Absolute: 0 10*3/uL (ref 0.0–0.1)
Eosinophils Absolute: 0.4 10*3/uL (ref 0.0–0.7)
Eosinophils Relative: 6 % — ABNORMAL HIGH (ref 0–5)
Lymphocytes Relative: 22 % (ref 12–46)
Lymphs Abs: 1.7 10*3/uL (ref 0.7–4.0)
Monocytes Absolute: 0.7 10*3/uL (ref 0.1–1.0)

## 2012-06-25 NOTE — Progress Notes (Signed)
Labs drawn today for cbc/diff 

## 2012-07-01 ENCOUNTER — Encounter (HOSPITAL_BASED_OUTPATIENT_CLINIC_OR_DEPARTMENT_OTHER): Payer: Medicare Other | Admitting: Oncology

## 2012-07-01 ENCOUNTER — Encounter (HOSPITAL_COMMUNITY): Payer: Self-pay | Admitting: Oncology

## 2012-07-01 VITALS — BP 134/82 | HR 88 | Temp 97.4°F | Wt 110.2 lb

## 2012-07-01 DIAGNOSIS — M4120 Other idiopathic scoliosis, site unspecified: Secondary | ICD-10-CM

## 2012-07-01 DIAGNOSIS — F3289 Other specified depressive episodes: Secondary | ICD-10-CM

## 2012-07-01 DIAGNOSIS — D649 Anemia, unspecified: Secondary | ICD-10-CM

## 2012-07-01 DIAGNOSIS — F329 Major depressive disorder, single episode, unspecified: Secondary | ICD-10-CM

## 2012-07-01 DIAGNOSIS — E46 Unspecified protein-calorie malnutrition: Secondary | ICD-10-CM

## 2012-07-01 NOTE — Patient Instructions (Signed)
[facility]  Discharge Instructions MILLENIA WALDVOGEL  [id number] 1941-02-03 Dr. Glenford Peers  RECOMMENDATIONS MADE BY THE CONSULTANT AND ANY TEST RESULTS WILL BE SENT TO YOUR REFERRING DOCTOR.   EXAM FINDINGS BY MD TODAY AND SIGNS AND SYMPTOMS TO REPORT TO CLINIC OR PRIMARY MD: Dr. Mariel Sleet will talk to Dr. Gerda Diss about possibility of adding a medication to help with nausea  MEDICATIONS PRESCRIBED: Meds reviewed and explained what they are used for    [facility] INSTRUCTIONS/FOLLOW-UP: Return to Clinic in 4 months   I acknowledge that I have been informed and understand all the instructions given to me and received a copy. I do not have any more questions at this time, but understand that I may call the Specialty Clinic at [facility] at [phone] during business hours should I have any further questions or need assistance in obtaining follow-up care.    __________________________________________  _____________  __________ Signature of Patient or Authorized Representative            Date                   Time    __________________________________________ Nurse's Signature

## 2012-07-01 NOTE — Progress Notes (Signed)
Problem #1 normocytic anemia thus far consistent with anemia of chronic disease though I have no chronic disease to blame this on presently other than I am concerned about malnutrition  Problem #2 scoliosis with recent surgery for sciatic pain on the left by Dr. Channing Mutters in [place] thus far without any improvement in her left leg and left low back pain.  Problem #3 right rotator cuff tear in the past  Problem #4 depression on Effexor  Problem #5 shingles diagnosed in December 2012 and since then she developed her anorexia and her anemia and her weight reduction of 10% of her body weight.  Problem #6 chronic constipation associated with IBS and occasional nausea. She states that she took the Megace for a short time but she thought it constipated her but it did increase her appetite but her weight is the same as it was when I first saw her in May. Her daughter is with her today and mention that when her mother stays with her she eats well and when she is on her own in her own home she doesn't eat well all. The patient described which he ate for breakfast this morning which was a piece of toasted bread and 2 small vegetable portions last night for supper only. So her number of calories of intake is markedly reduced.  She does not want to repeat the use of Megace so I will talk to Dr. Gerda Diss who is out of town this week but he will call me Monday. We could try Marinol potentially or prednisone 10 days out of 30. She is reluctant to try any of these suggestions. I think we need to follow her along but I do not think a bone marrow biopsy is indicated presently since this is a mild normocytic anemia with normal platelets and normal white cells. She promises to come back in 4 months.

## 2012-10-07 ENCOUNTER — Encounter (INDEPENDENT_AMBULATORY_CARE_PROVIDER_SITE_OTHER): Payer: Self-pay | Admitting: *Deleted

## 2012-11-03 ENCOUNTER — Other Ambulatory Visit (HOSPITAL_COMMUNITY): Payer: Medicare Other

## 2012-11-05 ENCOUNTER — Ambulatory Visit (HOSPITAL_COMMUNITY): Payer: Medicare Other | Admitting: Oncology

## 2012-11-10 ENCOUNTER — Ambulatory Visit (HOSPITAL_COMMUNITY): Payer: Medicare Other | Admitting: Oncology

## 2013-02-04 ENCOUNTER — Other Ambulatory Visit (HOSPITAL_COMMUNITY): Payer: Self-pay | Admitting: Family Medicine

## 2013-02-09 ENCOUNTER — Ambulatory Visit (HOSPITAL_COMMUNITY)
Admission: RE | Admit: 2013-02-09 | Discharge: 2013-02-09 | Disposition: A | Discharge status: Home / Self Care | Payer: Medicare Other | Source: Ambulatory Visit | Attending: Family Medicine | Admitting: Family Medicine

## 2013-02-09 DIAGNOSIS — Z780 Asymptomatic menopausal state: Secondary | ICD-10-CM | POA: Insufficient documentation

## 2013-02-09 DIAGNOSIS — M810 Age-related osteoporosis without current pathological fracture: Secondary | ICD-10-CM | POA: Insufficient documentation

## 2013-02-10 ENCOUNTER — Encounter (INDEPENDENT_AMBULATORY_CARE_PROVIDER_SITE_OTHER): Payer: Self-pay | Admitting: *Deleted

## 2013-02-10 ENCOUNTER — Telehealth (INDEPENDENT_AMBULATORY_CARE_PROVIDER_SITE_OTHER): Payer: Self-pay | Admitting: *Deleted

## 2013-02-10 ENCOUNTER — Other Ambulatory Visit (INDEPENDENT_AMBULATORY_CARE_PROVIDER_SITE_OTHER): Payer: Self-pay | Admitting: *Deleted

## 2013-02-10 DIAGNOSIS — Z1211 Encounter for screening for malignant neoplasm of colon: Secondary | ICD-10-CM

## 2013-02-10 DIAGNOSIS — Z86010 Personal history of colonic polyps: Secondary | ICD-10-CM

## 2013-02-10 MED ORDER — PEG 3350-KCL-NA BICARB-NACL 420 G PO SOLR: 4000.0000 mL | Freq: Once | ORAL | Status: DC

## 2013-02-10 NOTE — Telephone Encounter (Signed)
Procedure: tcs  Reason/Indication:  Hx polyps  Has patient had this procedure before?  Yes, 2008 (paper chart)  If so, when, by whom and where?    Is there a family history of colon cancer?  no  Who?  What age when diagnosed?    Is patient diabetic?   no      Does patient have prosthetic heart valve?  no  Do you have a pacemaker?  no  Has patient had joint replacement within last 12 months?  no  Is patient on Coumadin, Plavix and/or Aspirin? no  Medications: trazadone 50 mg daily, effexor 75 mg daily, lyrica 50 mg bid, eye vitamin, calcium  Allergies: pcn  Medication Adjustment:   Procedure date & time: 03/05/13 at 125

## 2013-02-11 NOTE — Telephone Encounter (Signed)
agree

## 2013-02-19 ENCOUNTER — Other Ambulatory Visit (HOSPITAL_COMMUNITY)
Admission: RE | Admit: 2013-02-19 | Discharge: 2013-02-19 | Disposition: A | Discharge status: Home / Self Care | Payer: Medicare Other | Source: Ambulatory Visit | Attending: Obstetrics and Gynecology | Admitting: Obstetrics and Gynecology

## 2013-02-19 ENCOUNTER — Other Ambulatory Visit: Payer: Self-pay | Admitting: Adult Health

## 2013-02-19 DIAGNOSIS — Z1151 Encounter for screening for human papillomavirus (HPV): Secondary | ICD-10-CM | POA: Insufficient documentation

## 2013-02-19 DIAGNOSIS — Z124 Encounter for screening for malignant neoplasm of cervix: Secondary | ICD-10-CM | POA: Insufficient documentation

## 2013-02-20 ENCOUNTER — Encounter (INDEPENDENT_AMBULATORY_CARE_PROVIDER_SITE_OTHER): Payer: Self-pay

## 2013-03-03 ENCOUNTER — Encounter (HOSPITAL_COMMUNITY): Payer: Self-pay | Admitting: Pharmacy Technician

## 2013-03-05 ENCOUNTER — Encounter (HOSPITAL_COMMUNITY): Payer: Self-pay | Admitting: *Deleted

## 2013-03-05 ENCOUNTER — Encounter (HOSPITAL_COMMUNITY)
Admission: RE | Disposition: A | Discharge status: Home / Self Care | Payer: Self-pay | Source: Ambulatory Visit | Attending: Internal Medicine

## 2013-03-05 ENCOUNTER — Ambulatory Visit (HOSPITAL_COMMUNITY)
Admission: RE | Admit: 2013-03-05 | Discharge: 2013-03-05 | Disposition: A | Discharge status: Home / Self Care | Payer: Medicare Other | Source: Ambulatory Visit | Attending: Internal Medicine | Admitting: Internal Medicine

## 2013-03-05 DIAGNOSIS — Z860100 Personal history of colon polyps, unspecified: Secondary | ICD-10-CM | POA: Insufficient documentation

## 2013-03-05 DIAGNOSIS — K644 Residual hemorrhoidal skin tags: Secondary | ICD-10-CM | POA: Insufficient documentation

## 2013-03-05 HISTORY — PX: COLONOSCOPY: SHX5424

## 2013-03-05 SURGERY — COLONOSCOPY
Anesthesia: Moderate Sedation

## 2013-03-05 MED ORDER — MIDAZOLAM HCL 5 MG/5ML IJ SOLN
INTRAMUSCULAR | Status: AC
  Filled 2013-03-05: qty 10

## 2013-03-05 MED ORDER — ONDANSETRON HCL 4 MG/2ML IJ SOLN
4.0000 mg | Freq: Once | INTRAMUSCULAR | Status: AC
  Administered 2013-03-05: 4 mg via INTRAVENOUS

## 2013-03-05 MED ORDER — SODIUM CHLORIDE 0.9 % IV SOLN
INTRAVENOUS | Status: DC
  Administered 2013-03-05: 1000 mL via INTRAVENOUS
  Administered 2013-03-05: 12:00:00 via INTRAVENOUS

## 2013-03-05 MED ORDER — STERILE WATER FOR IRRIGATION IR SOLN
Status: DC | PRN
  Administered 2013-03-05: 13:00:00

## 2013-03-05 MED ORDER — ONDANSETRON HCL 4 MG/2ML IJ SOLN
INTRAMUSCULAR | Status: AC
  Filled 2013-03-05: qty 2

## 2013-03-05 MED ORDER — MEPERIDINE HCL 50 MG/ML IJ SOLN
INTRAMUSCULAR | Status: AC
  Filled 2013-03-05: qty 1

## 2013-03-05 MED ORDER — MIDAZOLAM HCL 5 MG/5ML IJ SOLN
INTRAMUSCULAR | Status: DC | PRN
  Administered 2013-03-05: 1 mg via INTRAVENOUS
  Administered 2013-03-05: 2 mg via INTRAVENOUS
  Administered 2013-03-05 (×2): 1 mg via INTRAVENOUS

## 2013-03-05 MED ORDER — SODIUM CHLORIDE 0.45 % IV SOLN: INTRAVENOUS | Status: DC

## 2013-03-05 MED ORDER — MEPERIDINE HCL 50 MG/ML IJ SOLN
INTRAMUSCULAR | Status: DC | PRN
  Administered 2013-03-05 (×2): 25 mg via INTRAVENOUS

## 2013-03-05 NOTE — H&P (Signed)
Krystal Carpenter is an 72 y.o. female.   Chief Complaint: Patient is here for colonoscopy. HPI: Patient is 72 year old Caucasian female with history of colonic adenomas for surveillance colonoscopy. Her last exam was over 5 years ago at [facility] in [facility]. She denies abdominal pain change in bowel habits or rectal bleeding. Family history is negative for colorectal carcinoma.  Past Medical History  Diagnosis Date  . IBS (irritable bowel syndrome)   . Fibromyalgia   . Scoliosis   . Hiatal hernia   . Anxiety   . Depression   . Anemia   . Pinched vertebral nerve 2013    lower back    Past Surgical History  Procedure Laterality Date  . Gallbladder surgery  14 yrs ago    [facility]  . Neck surgery      spinal fluis leakage-patched with "pig bladder"  . Foot surgery      bilateral-bunionectomy  . Shoulder surgery      distal clavicle date unknown-right-  . Cholecystectomy  14 yrs ago    [facility]  . Back surgery  05/21/12    Family History  Problem Relation Age of Onset  . Heart disease    . Lung disease    . Cancer    . Asthma    . Diabetes    . Anesthesia problems Neg Hx   . Hypotension Neg Hx   . Malignant hyperthermia Neg Hx   . Pseudochol deficiency Neg Hx    Social History:  reports that she has never smoked. She has quit using smokeless tobacco. She reports that she does not drink alcohol or use illicit drugs.  Allergies:  Allergies  Allergen Reactions  . Penicillins Hives and Swelling    Medications Prior to Admission  Medication Sig Dispense Refill  . cycloSPORINE (RESTASIS) 0.05 % ophthalmic emulsion Place 1 drop into both eyes 2 (two) times daily.      Marland Kitchen ibuprofen (ADVIL) 200 MG tablet Take 200 mg by mouth every 6 (six) hours as needed.      . polyethylene glycol-electrolytes (NULYTELY/GOLYTELY) 420 G solution Take 4,000 mLs by mouth once.  4000 mL  0  . traZODone (DESYREL) 50 MG tablet Take 50 mg by mouth at bedtime.       Marland Kitchen venlafaxine (EFFEXOR) 75  MG tablet Take 75 mg by mouth daily.         No results found for this or any previous visit (from the past 48 hour(s)). No results found.  ROS  Blood pressure 143/79, pulse 77, temperature 97 F (36.1 C), temperature source Oral, resp. rate 16, height 5' 3.75" (1.619 m), weight 118 lb (53.524 kg), SpO2 97.00%. Physical Exam  Constitutional: She appears well-developed and well-nourished.  HENT:  Mouth/Throat: Oropharynx is clear and moist.  Eyes: Conjunctivae are normal.  Neck: No thyromegaly present.  Cardiovascular: Normal rate, regular rhythm and normal heart sounds.   No murmur heard. Respiratory: Effort normal and breath sounds normal.  GI: Soft. She exhibits no distension and no mass. There is no tenderness.  Musculoskeletal: She exhibits no edema.  Lymphadenopathy:    She has no cervical adenopathy.  Neurological: She is alert.  Skin: Skin is warm and dry.     Assessment/Plan History of colonic adenomas. Surveillance colonoscopy.  Divit Stipp U 03/05/2013, 1:21 PM

## 2013-03-05 NOTE — Op Note (Signed)
COLONOSCOPY PROCEDURE REPORT  PATIENT:  Krystal Carpenter  MR#:  [id number] Birthdate:  July 29, 1941, 72 y.o., female Endoscopist:  Dr. Malissa Hippo, MD Referred By:  Dr. Lilyan Punt, MD Procedure Date: 03/05/2013  Procedure:   Colonoscopy  Indications:  Patient is 72 year old Caucasian female with history of colonic adenomas who is undergoing surveillance colonoscopy.  Informed Consent:  The procedure and risks were reviewed with the patient and informed consent was obtained.  Medications:  Demerol 50 mg IV Versed 5 mg IV  Description of procedure:  After a digital rectal exam was performed, that colonoscope was advanced from the anus through the rectum and colon to the area of the cecum, ileocecal valve and appendiceal orifice. The cecum was deeply intubated. These structures were well-seen and photographed for the record. From the level of the cecum and ileocecal valve, the scope was slowly and cautiously withdrawn. The mucosal surfaces were carefully surveyed utilizing scope tip to flexion to facilitate fold flattening as needed. The scope was pulled down into the rectum where a thorough exam including retroflexion was performed.  Findings:   Prep excellent Normal mucosa of colon and rectum. No polyps or other abnormalities noted. Small hemorrhoids below the dentate line.   Therapeutic/Diagnostic Maneuvers Performed:  None  Complications:  None  Cecal Withdrawal Time:  8 minutes  Impression:  Examination performed to cecum. Small external hemorrhoids otherwise normal colonoscopy.  Recommendations:  Standard instructions given. Patient may consider next exam in 10 years. Krystal Carpenter U  03/05/2013 2:28 PM  CC: Dr. Lilyan Punt, MD & Dr. Bonnetta Barry ref. provider found

## 2013-03-06 ENCOUNTER — Encounter (HOSPITAL_COMMUNITY): Payer: Self-pay | Admitting: Internal Medicine

## 2013-03-12 ENCOUNTER — Encounter: Payer: Self-pay | Admitting: Adult Health

## 2013-05-01 ENCOUNTER — Encounter: Payer: Self-pay | Admitting: *Deleted

## 2013-05-05 ENCOUNTER — Ambulatory Visit (INDEPENDENT_AMBULATORY_CARE_PROVIDER_SITE_OTHER): Payer: Medicare Other | Admitting: Family Medicine

## 2013-05-05 ENCOUNTER — Encounter: Payer: Self-pay | Admitting: Family Medicine

## 2013-05-05 VITALS — BP 130/70 | Temp 98.3°F | Ht 63.75 in | Wt 121.4 lb

## 2013-05-05 DIAGNOSIS — R358 Other polyuria: Secondary | ICD-10-CM

## 2013-05-05 DIAGNOSIS — K589 Irritable bowel syndrome without diarrhea: Secondary | ICD-10-CM

## 2013-05-05 LAB — POCT URINALYSIS DIPSTICK: pH, UA: 5

## 2013-05-05 MED ORDER — HYOSCYAMINE SULFATE ER 0.375 MG PO TB12: 0.3750 mg | ORAL_TABLET | Freq: Two times a day (BID) | ORAL | Status: DC | PRN

## 2013-05-05 MED ORDER — CIPROFLOXACIN HCL 250 MG PO TABS: 250.0000 mg | ORAL_TABLET | Freq: Two times a day (BID) | ORAL | Status: DC

## 2013-05-05 NOTE — Progress Notes (Signed)
  Subjective:    Patient ID: Krystal Carpenter, female    DOB: 05/30/1941, 72 y.o.   MRN: [id number]  HPI Patient states that she has been experiencing fatigue for the past 6 weeks now. She states that she had a flare up of her irritable bowel syndrome last week and she wants to discuss starting on a medication called Colestid for the symptoms. - Her dietary habits are very poor she relates this to being essentially by herself. Constant fatigue- sleeps 5 to 6 hours per night, appetite fair, She denies vomiting she does have intermittent loose stools along with some constipation. Also a moderate amount of abdominal cramping and discomfort. She has had recent lab work which overall looked fairly good. Thyroid was checked about a year ago was normal. Family history noncontributory Review of Systems Negative for chest pain shortness of breath    Objective:   Physical Exam  Lungs are clear hearts regular pulse normal extremities no edema skin warm dry      Assessment & Plan:  Your small bowel-try Levbid one tablet twice daily followup 4 weeks. Fatigue-she does have urinary tract infection on today's exam Cipro twice a day 7 days hopefully this will help with the fatigue Better nutrition was encouraged

## 2013-05-29 ENCOUNTER — Other Ambulatory Visit: Payer: Self-pay | Admitting: Family Medicine

## 2013-06-02 ENCOUNTER — Ambulatory Visit (INDEPENDENT_AMBULATORY_CARE_PROVIDER_SITE_OTHER): Payer: Medicare Other | Admitting: Family Medicine

## 2013-06-02 ENCOUNTER — Encounter: Payer: Self-pay | Admitting: Family Medicine

## 2013-06-02 VITALS — BP 130/72 | HR 70 | Ht 63.75 in | Wt 122.0 lb

## 2013-06-02 DIAGNOSIS — D509 Iron deficiency anemia, unspecified: Secondary | ICD-10-CM | POA: Insufficient documentation

## 2013-06-02 DIAGNOSIS — K589 Irritable bowel syndrome without diarrhea: Secondary | ICD-10-CM

## 2013-06-02 DIAGNOSIS — N390 Urinary tract infection, site not specified: Secondary | ICD-10-CM

## 2013-06-02 LAB — POCT URINALYSIS DIPSTICK: pH, UA: 5

## 2013-06-02 MED ORDER — ALPRAZOLAM 0.25 MG PO TABS: 0.2500 mg | ORAL_TABLET | Freq: Three times a day (TID) | ORAL | Status: AC | PRN

## 2013-06-02 MED ORDER — VENLAFAXINE HCL ER 150 MG PO CP24: 150.0000 mg | ORAL_CAPSULE | Freq: Every day | ORAL | Status: DC

## 2013-06-02 NOTE — Progress Notes (Signed)
  Subjective:    Patient ID: Krystal Carpenter, female    DOB: 1941/03/14, 72 y.o.   MRN: [id number]  HPI  Patient here for a recheck UTI. -some frequency but no burning. No fever. Having fatigue is constant but seems to be getting worse. Laying around a lot, eating fair.poor dietary habits. She relates the fatigue as her primary symptom of her urinary tract infection she denies excessive thirst she does state some frequency of urination  Patient states she has been feeling tired and nervous.she admits to getting anxious, relates feeling sad at times, stressed. It has been going on for awhile. Patient denies being suicidal. She does states low energy and somewhat down and depressed. Concerns IBS she has frequent bowel movements medication seems to help a little bit she has a lot of cramps and discomfort she denies bloody stools or mucousy stools Past medical history irritable bowel family history noncontributory social does not smoke Review of Systems She denies head congestion drainage coughing she denies headaches she does relate fatigue tiredness she denies sweats fevers chills she denies diarrhea she does relate some joint pain intermittent abdominal pain denies chest pressure or pain or shortness of breath    Objective:   Physical Exam Vital signs stable neck is supple no masses lungs are clear no crackles heart is regular abdomen is soft no guarding rebound or tenderness extremities no edema skin warm dry neurologic grossly normal       Assessment & Plan:  #1 probable UTI culture urine await results, may have to call in antibiotics her urinalysis did not show any obvious infection #2 fatigue and tiredness could be related to the anemia and things long lines we will check some lab work #3 irritable bowel we talked about ways to try to help keep this under better control #4 possible increased depression issues increase Effexor Xanax only if necessary 0.25 mg Patient is to followup in  approximately 3 months. 25 minutes spent with patient [id number]. He

## 2013-06-04 LAB — CBC WITH DIFFERENTIAL/PLATELET
HCT: 33.7 % — ABNORMAL LOW (ref 36.0–46.0)
Hemoglobin: 12 g/dL (ref 12.0–15.0)
Lymphs Abs: 1.6 10*3/uL (ref 0.7–4.0)
MCHC: 35.6 g/dL (ref 30.0–36.0)
Monocytes Absolute: 0.7 10*3/uL (ref 0.1–1.0)
Monocytes Relative: 11 % (ref 3–12)
Neutro Abs: 3.9 10*3/uL (ref 1.7–7.7)
Neutrophils Relative %: 62 % (ref 43–77)
RBC: 3.64 MIL/uL — ABNORMAL LOW (ref 3.87–5.11)

## 2013-06-04 LAB — URINE CULTURE

## 2013-07-14 ENCOUNTER — Ambulatory Visit: Payer: Medicare Other | Admitting: Family Medicine

## 2013-08-21 ENCOUNTER — Other Ambulatory Visit: Payer: Self-pay | Admitting: Family Medicine

## 2013-11-01 ENCOUNTER — Encounter: Payer: Self-pay | Admitting: Family Medicine

## 2013-11-01 DIAGNOSIS — C439 Malignant melanoma of skin, unspecified: Secondary | ICD-10-CM | POA: Insufficient documentation

## 2013-11-20 ENCOUNTER — Other Ambulatory Visit: Payer: Self-pay | Admitting: Family Medicine

## 2014-01-19 ENCOUNTER — Encounter: Payer: Self-pay | Admitting: Family Medicine

## 2014-01-19 ENCOUNTER — Ambulatory Visit (INDEPENDENT_AMBULATORY_CARE_PROVIDER_SITE_OTHER): Payer: Medicare Other | Admitting: Family Medicine

## 2014-01-19 VITALS — BP 122/74 | Temp 98.5°F | Ht 64.0 in | Wt 118.0 lb

## 2014-01-19 DIAGNOSIS — R609 Edema, unspecified: Secondary | ICD-10-CM

## 2014-01-19 DIAGNOSIS — J069 Acute upper respiratory infection, unspecified: Secondary | ICD-10-CM

## 2014-01-19 DIAGNOSIS — R600 Localized edema: Secondary | ICD-10-CM

## 2014-01-19 MED ORDER — BENZONATATE 100 MG PO CAPS: 100.0000 mg | ORAL_CAPSULE | Freq: Three times a day (TID) | ORAL | Status: DC | PRN

## 2014-01-19 MED ORDER — AZITHROMYCIN 250 MG PO TABS: ORAL_TABLET | ORAL | Status: DC

## 2014-01-19 NOTE — Progress Notes (Signed)
   Subjective:    Patient ID: Krystal Carpenter, female    DOB: [birth year], 72 y.o.   MRN: [id number]  Cough This is a new problem. The current episode started in the past 7 days. Associated symptoms include headaches. Associated symptoms comments: Runny nose. Treatments tried: alka seltza cold, allergy and cough meds.   Patient somewhat stressed about recent events with melanoma but she seems to be handling it fairly well. She does have intermittent swelling of the right lower leg where she had groin dissection of the lymph nodes. It was discussed with her how important it was to try to keep the swelling at a minimum.   Review of Systems  Respiratory: Positive for cough.   Neurological: Positive for headaches.   she denies fever wheezing difficulty breathing nausea vomiting diarrhea     Objective:   Physical Exam Makes good eye contact not toxic neck no masses lungs are clear no crackles heart is regular eardrums normal throat is normal       Assessment & Plan:  Viral syndrome with secondary sinusitis-antibiotics prescribed Tessalon for cough warning signs discussed she is currently undergoing treatment for melanoma I find no evidence of any type of pneumonia or secondary infection do to that chemotherapy and radiation  Pedal edema-she will use knee-high surgical hose for now but she will see the specialist at [facility] most likely they're do some teaching of physical therapy aspect she can do to keep the swelling under somewhat control plus also there teacher how to use thigh high support stocking

## 2014-01-29 ENCOUNTER — Other Ambulatory Visit: Payer: Self-pay | Admitting: Family Medicine

## 2014-01-29 ENCOUNTER — Telehealth: Payer: Self-pay | Admitting: Family Medicine

## 2014-01-29 MED ORDER — DOXYCYCLINE HYCLATE 100 MG PO CAPS: 100.0000 mg | ORAL_CAPSULE | Freq: Two times a day (BID) | ORAL | Status: DC

## 2014-01-29 NOTE — Telephone Encounter (Signed)
Please inform the patient that I sent a prescription [place] for doxycycline. Take one twice daily with a snack. Also a tall glass of water afterwards. Let us know if any problems. If worse needs to followup.

## 2014-01-29 NOTE — Telephone Encounter (Signed)
Patient notified

## 2014-01-29 NOTE — Telephone Encounter (Signed)
Pt calling to say she doesn't feel good still, head very congested, no fever, draining into throat, small cough but has pills for that  Wants to know since the zpak hasn't worked can you call in something stronger to   Pt seen 01/19/14  [facility]

## 2014-02-01 ENCOUNTER — Telehealth: Payer: Self-pay | Admitting: *Deleted

## 2014-02-01 NOTE — Telephone Encounter (Signed)
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

## 2014-02-03 ENCOUNTER — Other Ambulatory Visit: Payer: Self-pay | Admitting: Family Medicine

## 2014-02-03 ENCOUNTER — Encounter: Payer: Self-pay | Admitting: Adult Health

## 2014-02-03 ENCOUNTER — Ambulatory Visit (INDEPENDENT_AMBULATORY_CARE_PROVIDER_SITE_OTHER): Payer: Medicare Other | Admitting: Adult Health

## 2014-02-03 VITALS — BP 140/76 | Ht 64.0 in | Wt 118.5 lb

## 2014-02-03 DIAGNOSIS — L0233 Carbuncle of buttock: Secondary | ICD-10-CM

## 2014-02-03 DIAGNOSIS — L0232 Furuncle of buttock: Secondary | ICD-10-CM

## 2014-02-03 HISTORY — DX: Furuncle of buttock: L02.32

## 2014-02-03 MED ORDER — CLINDAMYCIN HCL 300 MG PO CAPS: ORAL_CAPSULE | ORAL | Status: DC

## 2014-02-03 NOTE — Progress Notes (Signed)
Subjective:     Patient ID: Krystal Carpenter, female   DOB: March 26, 1941, 73 y.o.   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  Review of Systems See HPI Reviewed past medical,surgical, social and family history. Reviewed medications and allergies.     Objective:   Physical Exam BP 140/76  Ht 5\' 4"  (1.626 m)  Wt 118 lb 8 oz (53.751 kg)  BMI 20.33 kg/m2   Has tender area left buttock,?boil, has thickness, mild redness, Dr Krystal Carpenter in to check too. Assessment:     Boil    Plan:     Rx cleocin 600 mg tid x 10 days Return in 2 weeks for recheck Review handout on abscess

## 2014-02-03 NOTE — Patient Instructions (Signed)
Abscess An abscess is an infected area that contains a collection of pus and debris.It can occur in almost any part of the body. An abscess is also known as a furuncle or boil. CAUSES  An abscess occurs when tissue gets infected. This can occur from blockage of oil or sweat glands, infection of hair follicles, or a minor injury to the skin. As the body tries to fight the infection, pus collects in the area and creates pressure under the skin. This pressure causes pain. People with weakened immune systems have difficulty fighting infections and get certain abscesses more often.  SYMPTOMS Usually an abscess develops on the skin and becomes a painful mass that is red, warm, and tender. If the abscess forms under the skin, you may feel a moveable soft area under the skin. Some abscesses break open (rupture) on their own, but most will continue to get worse without care. The infection can spread deeper into the body and eventually into the bloodstream, causing you to feel ill.  DIAGNOSIS  Your caregiver will take your medical history and perform a physical exam. A sample of fluid may also be taken from the abscess to determine what is causing your infection. TREATMENT  Your caregiver may prescribe antibiotic medicines to fight the infection. However, taking antibiotics alone usually does not cure an abscess. Your caregiver may need to make a small cut (incision) in the abscess to drain the pus. In some cases, gauze is packed into the abscess to reduce pain and to continue draining the area. HOME CARE INSTRUCTIONS   Only take over-the-counter or prescription medicines for pain, discomfort, or fever as directed by your caregiver.  If you were prescribed antibiotics, take them as directed. Finish them even if you start to feel better.  If gauze is used, follow your caregiver's directions for changing the gauze.  To avoid spreading the infection:  Keep your draining abscess covered with a  bandage.  Wash your hands well.  Do not share personal care items, towels, or whirlpools with others.  Avoid skin contact with others.  Keep your skin and clothes clean around the abscess.  Keep all follow-up appointments as directed by your caregiver. SEEK MEDICAL CARE IF:   You have increased pain, swelling, redness, fluid drainage, or bleeding.  You have muscle aches, chills, or a general ill feeling.  You have a fever. MAKE SURE YOU:   Understand these instructions.  Will watch your condition.  Will get help right away if you are not doing well or get worse. Document Released: 09/05/2005 Document Revised: 05/27/2012 Document Reviewed: 02/08/2012 [facility] Patient Information 2014 [place]. Take cleocin 600 mg tid x 10 days  Return in 2 weeks

## 2014-02-05 ENCOUNTER — Telehealth: Payer: Self-pay | Admitting: Adult Health

## 2014-02-05 NOTE — Telephone Encounter (Signed)
Cleocin made her sick and she vomited and felt funny in jaws.So will stop cleocin and start back on doxy 1 bid  And keep appt.

## 2014-02-17 ENCOUNTER — Ambulatory Visit: Payer: Medicare Other | Admitting: Adult Health

## 2014-05-07 ENCOUNTER — Other Ambulatory Visit: Payer: Self-pay | Admitting: Family Medicine

## 2014-07-05 ENCOUNTER — Other Ambulatory Visit: Payer: Self-pay | Admitting: Family Medicine

## 2014-08-03 ENCOUNTER — Telehealth: Payer: Self-pay | Admitting: Family Medicine

## 2014-08-03 NOTE — Telephone Encounter (Signed)
Patient said that when she went to the pharmacy last to refill her meds, she had no refills left. Is she due for an appointment?

## 2014-08-03 NOTE — Telephone Encounter (Signed)
No med checks since Epic- only acute visits- Needs office visit

## 2014-08-03 NOTE — Telephone Encounter (Signed)
Left message to return call 

## 2014-08-05 ENCOUNTER — Other Ambulatory Visit: Payer: Self-pay | Admitting: Family Medicine

## 2014-08-05 NOTE — Telephone Encounter (Signed)
Transferred patient to front desk to schedule appointment.  

## 2014-08-17 ENCOUNTER — Other Ambulatory Visit: Payer: Self-pay | Admitting: Nurse Practitioner

## 2014-08-24 ENCOUNTER — Ambulatory Visit (INDEPENDENT_AMBULATORY_CARE_PROVIDER_SITE_OTHER): Payer: Medicare Other | Admitting: Family Medicine

## 2014-08-24 ENCOUNTER — Encounter: Payer: Self-pay | Admitting: Family Medicine

## 2014-08-24 VITALS — BP 108/64 | Temp 98.5°F | Ht 63.75 in | Wt 121.0 lb

## 2014-08-24 DIAGNOSIS — R739 Hyperglycemia, unspecified: Secondary | ICD-10-CM

## 2014-08-24 DIAGNOSIS — E785 Hyperlipidemia, unspecified: Secondary | ICD-10-CM

## 2014-08-24 DIAGNOSIS — C439 Malignant melanoma of skin, unspecified: Secondary | ICD-10-CM

## 2014-08-24 DIAGNOSIS — Z23 Encounter for immunization: Secondary | ICD-10-CM

## 2014-08-24 DIAGNOSIS — R350 Frequency of micturition: Secondary | ICD-10-CM

## 2014-08-24 DIAGNOSIS — Z79899 Other long term (current) drug therapy: Secondary | ICD-10-CM

## 2014-08-24 DIAGNOSIS — R7309 Other abnormal glucose: Secondary | ICD-10-CM

## 2014-08-24 LAB — POCT URINALYSIS DIPSTICK
PH UA: 5
Spec Grav, UA: 1.02

## 2014-08-24 LAB — BASIC METABOLIC PANEL
BUN: 22 mg/dL (ref 6–23)
CO2: 29 mEq/L (ref 19–32)
Calcium: 9.1 mg/dL (ref 8.4–10.5)
Chloride: 105 mEq/L (ref 96–112)
Creat: 1.07 mg/dL (ref 0.50–1.10)
Glucose, Bld: 93 mg/dL (ref 70–99)
POTASSIUM: 4.3 meq/L (ref 3.5–5.3)
Sodium: 141 mEq/L (ref 135–145)

## 2014-08-24 LAB — LIPID PANEL
CHOLESTEROL: 247 mg/dL — AB (ref 0–200)
HDL: 81 mg/dL (ref >39)
LDL CALC: 152 mg/dL — AB (ref 0–99)
TRIGLYCERIDES: 68 mg/dL (ref <150)
Total CHOL/HDL Ratio: 3 Ratio
VLDL: 14 mg/dL (ref 0–40)

## 2014-08-24 LAB — HEPATIC FUNCTION PANEL
ALK PHOS: 66 U/L (ref 39–117)
ALT: 21 U/L (ref 0–35)
AST: 25 U/L (ref 0–37)
Albumin: 4.2 g/dL (ref 3.5–5.2)
BILIRUBIN DIRECT: 0.1 mg/dL (ref 0.0–0.3)
Indirect Bilirubin: 0.4 mg/dL (ref 0.2–1.2)
Total Bilirubin: 0.5 mg/dL (ref 0.2–1.2)
Total Protein: 6.7 g/dL (ref 6.0–8.3)

## 2014-08-24 LAB — HEMOGLOBIN A1C
HEMOGLOBIN A1C: 5.8 % — AB (ref <5.7)
MEAN PLASMA GLUCOSE: 120 mg/dL — AB (ref <117)

## 2014-08-24 MED ORDER — TRAZODONE HCL 50 MG PO TABS: ORAL_TABLET | ORAL | Status: DC

## 2014-08-24 MED ORDER — VENLAFAXINE HCL ER 150 MG PO CP24: ORAL_CAPSULE | ORAL | Status: DC

## 2014-08-24 MED ORDER — PANTOPRAZOLE SODIUM 40 MG PO TBEC: DELAYED_RELEASE_TABLET | ORAL | Status: AC

## 2014-08-24 NOTE — Progress Notes (Signed)
   Subjective:    Patient ID: Krystal Carpenter, female    DOB: 16-Jul-1941, 73 y.o.   MRN: [id number]  HPIMed check up.   Concerns about neck pain on right side and headache. Started about 3 -4 weeks. Pt states they did a scan at [facility] of her head. Pt states the scan was normal. Scan was on Aug 3rd. She describes pain in the back side of her head radiates up the back of the neck. She will try massage and pain medication if this doesn't help consider neurology for injections. I do not feel this is a sign of melanoma. Scans were reviewed by myself.  Weak spells, dizzy, nausea. Started 3 -4 weeks ago. She denies chest pain shortness breath nausea or vomiting  Frequent urination. Started a long time ago. No pain.   Needs refill on protonix, trazone, and effexor.   Review of Systems  Constitutional: Negative for activity change, appetite change and fatigue.  Respiratory: Negative for cough, choking and chest tightness.   Cardiovascular: Negative for chest pain.  Gastrointestinal: Negative for nausea and abdominal distention.  Endocrine: Negative for polydipsia and polyphagia.  Genitourinary: Positive for frequency.  Musculoskeletal: Positive for arthralgias. Negative for gait problem and joint swelling.  Neurological: Negative for weakness.  Psychiatric/Behavioral: Negative for confusion.       Objective:   Physical Exam  Vitals reviewed. Constitutional: She appears well-nourished. No distress.  Cardiovascular: Normal rate, regular rhythm and normal heart sounds.   No murmur heard. Pulmonary/Chest: Effort normal and breath sounds normal. No respiratory distress.  Musculoskeletal: She exhibits no edema.  Lymphadenopathy:    She has no cervical adenopathy.  Neurological: She is alert. She exhibits normal muscle tone.  Psychiatric: Her behavior is normal.          Assessment & Plan:  Significant reflux under the care of specialists will be seeing gastroenterology specialist at [facility]  continue current medication watch diet  I don't find any evidence of any significant underlying illness of her weakness I encourage her to ENT stay physically active  No sign of urinary dysfunction.  Refills given on her standard medications  1. Frequent urination Await culture did not see any sign of infection - POCT urinalysis dipstick - Urine culture  2. Melanoma of skin, site unspecified She has multiple small melanomas on her legs she is following up with specialists faces uncertainty regarding this probable more surgery  3. Hyperglycemia History pre-diabetes check A1c - Hemoglobin A1c  4. Other and unspecified hyperlipidemia History of hyperlipidemia watch diet closely check lipid - Lipid panel  5. Encounter for long-term (current) use of other medications Because of chronic medications check liver function metabolic 7 - Hepatic function panel - Basic metabolic panel  6. Need for prophylactic vaccination against Streptococcus pneumoniae (pneumococcus) This be given today - Pneumococcal conjugate vaccine 13-valent IM  7. Need for prophylactic vaccination and inoculation against influenza This be given today  Followup 6 months

## 2014-08-26 LAB — URINE CULTURE

## 2014-09-06 ENCOUNTER — Other Ambulatory Visit: Payer: Self-pay | Admitting: Family Medicine

## 2014-10-11 ENCOUNTER — Encounter: Payer: Self-pay | Admitting: Family Medicine

## 2015-01-17 ENCOUNTER — Emergency Department (HOSPITAL_COMMUNITY): Payer: Medicare Other

## 2015-01-17 ENCOUNTER — Encounter (HOSPITAL_COMMUNITY): Payer: Self-pay | Admitting: *Deleted

## 2015-01-17 ENCOUNTER — Emergency Department (HOSPITAL_COMMUNITY)
Admission: EM | Admit: 2015-01-17 | Discharge: 2015-01-17 | Disposition: A | Discharge status: Home / Self Care | Payer: Medicare Other | Attending: Emergency Medicine | Admitting: Emergency Medicine

## 2015-01-17 DIAGNOSIS — Z8719 Personal history of other diseases of the digestive system: Secondary | ICD-10-CM | POA: Diagnosis not present

## 2015-01-17 DIAGNOSIS — F419 Anxiety disorder, unspecified: Secondary | ICD-10-CM | POA: Diagnosis not present

## 2015-01-17 DIAGNOSIS — M791 Myalgia, unspecified site: Secondary | ICD-10-CM

## 2015-01-17 DIAGNOSIS — Z792 Long term (current) use of antibiotics: Secondary | ICD-10-CM | POA: Diagnosis not present

## 2015-01-17 DIAGNOSIS — Z862 Personal history of diseases of the blood and blood-forming organs and certain disorders involving the immune mechanism: Secondary | ICD-10-CM | POA: Diagnosis not present

## 2015-01-17 DIAGNOSIS — Z880 Allergy status to penicillin: Secondary | ICD-10-CM | POA: Insufficient documentation

## 2015-01-17 DIAGNOSIS — Z85828 Personal history of other malignant neoplasm of skin: Secondary | ICD-10-CM | POA: Insufficient documentation

## 2015-01-17 DIAGNOSIS — R51 Headache: Secondary | ICD-10-CM | POA: Diagnosis present

## 2015-01-17 DIAGNOSIS — G8929 Other chronic pain: Secondary | ICD-10-CM | POA: Diagnosis not present

## 2015-01-17 DIAGNOSIS — M549 Dorsalgia, unspecified: Secondary | ICD-10-CM | POA: Diagnosis not present

## 2015-01-17 DIAGNOSIS — Z79899 Other long term (current) drug therapy: Secondary | ICD-10-CM | POA: Diagnosis not present

## 2015-01-17 DIAGNOSIS — M419 Scoliosis, unspecified: Secondary | ICD-10-CM | POA: Diagnosis not present

## 2015-01-17 DIAGNOSIS — R6883 Chills (without fever): Secondary | ICD-10-CM | POA: Diagnosis not present

## 2015-01-17 DIAGNOSIS — Z872 Personal history of diseases of the skin and subcutaneous tissue: Secondary | ICD-10-CM | POA: Diagnosis not present

## 2015-01-17 DIAGNOSIS — F329 Major depressive disorder, single episode, unspecified: Secondary | ICD-10-CM | POA: Insufficient documentation

## 2015-01-17 LAB — BASIC METABOLIC PANEL
ANION GAP: 7 (ref 5–15)
BUN: 19 mg/dL (ref 6–23)
CHLORIDE: 104 mmol/L (ref 96–112)
CO2: 29 mmol/L (ref 19–32)
Calcium: 8.8 mg/dL (ref 8.4–10.5)
Creatinine, Ser: 1.01 mg/dL (ref 0.50–1.10)
GFR calc Af Amer: 62 mL/min — ABNORMAL LOW (ref >90)
GFR, EST NON AFRICAN AMERICAN: 54 mL/min — AB (ref >90)
Glucose, Bld: 134 mg/dL — ABNORMAL HIGH (ref 70–99)
Potassium: 3.6 mmol/L (ref 3.5–5.1)
SODIUM: 140 mmol/L (ref 135–145)

## 2015-01-17 LAB — CBC WITH DIFFERENTIAL/PLATELET
BASOS PCT: 0 % (ref 0–1)
Basophils Absolute: 0 10*3/uL (ref 0.0–0.1)
EOS ABS: 0 10*3/uL (ref 0.0–0.7)
Eosinophils Relative: 0 % (ref 0–5)
HEMATOCRIT: 37.2 % (ref 36.0–46.0)
Hemoglobin: 12.1 g/dL (ref 12.0–15.0)
LYMPHS PCT: 1 % — AB (ref 12–46)
Lymphs Abs: 0 10*3/uL — ABNORMAL LOW (ref 0.7–4.0)
MCH: 32 pg (ref 26.0–34.0)
MCHC: 32.5 g/dL (ref 30.0–36.0)
MCV: 98.4 fL (ref 78.0–100.0)
MONO ABS: 0 10*3/uL — AB (ref 0.1–1.0)
Monocytes Relative: 1 % — ABNORMAL LOW (ref 3–12)
Neutro Abs: 4.8 10*3/uL (ref 1.7–7.7)
Neutrophils Relative %: 98 % — ABNORMAL HIGH (ref 43–77)
PLATELETS: 194 10*3/uL (ref 150–400)
RBC: 3.78 MIL/uL — ABNORMAL LOW (ref 3.87–5.11)
RDW: 13.5 % (ref 11.5–15.5)
WBC: 4.8 10*3/uL (ref 4.0–10.5)

## 2015-01-17 MED ORDER — ONDANSETRON HCL 4 MG/2ML IJ SOLN
4.0000 mg | INTRAMUSCULAR | Status: DC | PRN
  Administered 2015-01-17: 4 mg via INTRAVENOUS
  Filled 2015-01-17: qty 2

## 2015-01-17 MED ORDER — LORAZEPAM 0.5 MG PO TABS: 0.5000 mg | ORAL_TABLET | Freq: Three times a day (TID) | ORAL | Status: AC | PRN

## 2015-01-17 MED ORDER — FENTANYL CITRATE 0.05 MG/ML IJ SOLN
INTRAMUSCULAR | Status: AC
Start: 2015-01-17 — End: 2015-01-17
  Administered 2015-01-17: 100 ug via INTRAVENOUS
  Filled 2015-01-17: qty 2

## 2015-01-17 MED ORDER — FENTANYL CITRATE 0.05 MG/ML IJ SOLN
100.0000 ug | INTRAMUSCULAR | Status: DC | PRN
  Administered 2015-01-17: 100 ug via INTRAVENOUS
  Filled 2015-01-17: qty 2

## 2015-01-17 MED ORDER — FENTANYL CITRATE 0.05 MG/ML IJ SOLN
50.0000 ug | Freq: Once | INTRAMUSCULAR | Status: AC
  Administered 2015-01-17: 100 ug via INTRAVENOUS

## 2015-01-17 MED ORDER — KETOROLAC TROMETHAMINE 30 MG/ML IJ SOLN
30.0000 mg | Freq: Once | INTRAMUSCULAR | Status: AC
  Administered 2015-01-17: 30 mg via INTRAVENOUS
  Filled 2015-01-17: qty 1

## 2015-01-17 MED ORDER — LORAZEPAM 2 MG/ML IJ SOLN
0.5000 mg | Freq: Once | INTRAMUSCULAR | Status: AC
  Administered 2015-01-17: 0.5 mg via INTRAVENOUS
  Filled 2015-01-17: qty 1

## 2015-01-17 NOTE — ED Provider Notes (Signed)
CSN: [id number]     Arrival date & time 01/17/15  1914 History   First MD Initiated Contact with Patient 01/17/15 1921    This chart was scribed for No att. providers found by Terressa Koyanagi, ED Scribe. This patient was seen in room APA10/APA10 and the patient's care was started at 7:31 PM.  Chief Complaint  Patient presents with  . Pain   The history is provided by the patient and a relative. No language interpreter was used.   PCP: Sallee Lange, MD HPI Comments: Krystal Carpenter is a 74 y.o. female, with PMH noted below including cancer for which she is undergoing oncolytic virus Tx at [facility], who presents to the Emergency Department complaining of pain to her back, legs and associated chills onset 15 minutes following her 3rd oncolytic virus Tx today. Per family member, pt had a similar response following her second Tx -- whereby, pt experienced chills and pain. Pt's family member notes, during the prior episode, pt's Sx resolved the following day. Family member further notes that the Sx during the prior episode were not as severe as pt's current Sx. Pt denies fever, SOB, decreased appetite, nausea.   Past Medical History  Diagnosis Date  . IBS (irritable bowel syndrome)   . Scoliosis   . Anxiety   . Depression   . Anemia   . Pinched vertebral nerve 2013    lower back  . Osteopenia   . Fibromyalgia   . Hiatal hernia   . Osteoporosis   . Cancer     melonoma  . Boil of buttock 02/03/2014   Past Surgical History  Procedure Laterality Date  . Gallbladder surgery  14 yrs ago    mmh  . Neck surgery      spinal fluis leakage-patched with "pig bladder"  . Foot surgery      bilateral-bunionectomy  . Shoulder surgery      distal clavicle date unknown-right-[place]  . Cholecystectomy  14 yrs ago    mmh  . Back surgery  05/21/12  . Colonoscopy N/A 03/05/2013    Procedure: COLONOSCOPY;  Surgeon: Rogene Houston, MD;  Location: [facility];  Service: Endoscopy;  Laterality: N/A;  125  patient will be at ENDO at 7 am to prep and will bring her own solution for prep   Family History  Problem Relation Age of Onset  . Heart disease    . Lung disease    . Cancer    . Asthma    . Diabetes    . Anesthesia problems Neg Hx   . Hypotension Neg Hx   . Malignant hyperthermia Neg Hx   . Pseudochol deficiency Neg Hx   . Heart attack Mother   . Heart attack Father   . Hypertension Sister    History  Substance Use Topics  . Smoking status: Never Smoker   . Smokeless tobacco: Former Systems developer  . Alcohol Use: No   OB History    Gravida Para Term Preterm AB TAB SAB Ectopic Multiple Living   3 3        3      Review of Systems  Constitutional: Positive for chills. Negative for fever and appetite change.  Respiratory: Negative for shortness of breath.   Gastrointestinal: Negative for nausea.  Musculoskeletal: Positive for back pain.       Pain LE.   All other systems reviewed and are negative.     Allergies  Penicillins and Sulfa antibiotics  Home Medications  Prior to Admission medications   Medication Sig Start Date End Date Taking? Authorizing Provider  BIOTIN PO Take 1 tablet by mouth daily. 12/28/14  Yes Historical Provider, MD  cycloSPORINE (RESTASIS) 0.05 % ophthalmic emulsion Place 1 drop into both eyes 2 (two) times daily as needed (for dry eye relief). PRN   Yes Historical Provider, MD  docusate sodium (COLACE) 100 MG capsule Take 100 mg by mouth daily as needed. FOR CONSTIPATION   Yes Historical Provider, MD  gabapentin (NEURONTIN) 300 MG capsule Take 300 mg by mouth 3 (three) times daily. 01/11/15 01/11/16 Yes Historical Provider, MD  HYDROcodone-acetaminophen (NORCO/VICODIN) 5-325 MG per tablet Take 1 tablet by mouth every 6 (six) hours as needed. FOR PAIN BUT NOT TO TAKE WITH OXYCODONE 12/28/14  Yes Historical Provider, MD  hydrOXYzine (ATARAX/VISTARIL) 25 MG tablet Take 25 mg by mouth 3 (three) times daily as needed. FOR ITCHING 01/17/15 01/27/15 Yes Historical  Provider, MD  oxyCODONE (OXY IR/ROXICODONE) 5 MG immediate release tablet Take 5 mg by mouth every 4 (four) hours as needed. FOR PAIN 01/11/15 01/21/15 Yes Historical Provider, MD  pantoprazole (PROTONIX) 40 MG tablet TAKE ONE (1) TABLET EACH DAY Patient taking differently: Take 40 mg by mouth daily as needed (for acid reflux).  08/24/14  Yes Kathyrn Drown, MD  traZODone (DESYREL) 50 MG tablet TAKE ONE AND ONE-HALF TABLETS AT BEDTIME Patient taking differently: Take 75 mg by mouth at bedtime.  08/24/14  Yes Kathyrn Drown, MD  venlafaxine XR (EFFEXOR-XR) 150 MG 24 hr capsule TAKE ONE CAPSULE BY MOUTH DAILY Patient taking differently: TAKE ONE CAPSULE BY MOUTH DAILY AT BEDTIME 09/06/14  Yes Scott A Luking, MD  LORazepam (ATIVAN) 0.5 MG tablet Take 1 tablet (0.5 mg total) by mouth every 8 (eight) hours as needed (Muscle spasm). 01/17/15   Richarda Blade, MD   Triage Vitals: BP 162/62 mmHg  Pulse 92  Temp(Src) 99.5 F (37.5 C) (Oral)  Resp 22  Ht 5\' 4"  (1.626 m)  Wt 131 lb (59.421 kg)  BMI 22.47 kg/m2  SpO2 98% Physical Exam  Constitutional: She is oriented to person, place, and time. She appears well-developed and well-nourished. She appears distressed (very uncomfortable; crying and moaning).  HENT:  Head: Normocephalic and atraumatic.  Eyes: Conjunctivae and EOM are normal. Pupils are equal, round, and reactive to light.  Neck: Normal range of motion and phonation normal. Neck supple.  Cardiovascular: Normal rate and regular rhythm.   Pulmonary/Chest: Effort normal and breath sounds normal. She exhibits no tenderness.  Abdominal: Soft. She exhibits no distension. There is no tenderness. There is no guarding.  Musculoskeletal: Normal range of motion.  Neurological: She is alert and oriented to person, place, and time. She exhibits normal muscle tone.  Skin: Skin is warm and dry.  Innumerable red papules of right leg, mostly clustered on anterior thigh  Psychiatric: Her behavior is normal.  Judgment and thought content normal.  anxious  Nursing note and vitals reviewed.   ED Course  Procedures (including critical care time) Medications  fentaNYL (SUBLIMAZE) injection 50 mcg (100 mcg Intravenous Given 01/17/15 1935)  ketorolac (TORADOL) 30 MG/ML injection 30 mg (30 mg Intravenous Given 01/17/15 2145)  LORazepam (ATIVAN) injection 0.5 mg (0.5 mg Intravenous Given 01/17/15 2145)   Patient Vitals for the past 24 hrs:  BP Temp Temp src Pulse Resp SpO2 Height Weight  01/17/15 2227 (!) 95/48 mmHg - - 93 19 97 % - -  01/17/15 2050 125/59 mmHg - -  87 21 100 % - -  01/17/15 2036 - - - 93 - 96 % - -  01/17/15 1918 162/62 mmHg 99.5 F (37.5 C) Oral 92 22 98 % 5\' 4"  (1.626 m) 131 lb (59.421 kg)    7:39 PM-Discussed treatment plan which includes meds with pt at bedside and pt agreed to plan.   Labs Review Labs Reviewed  BASIC METABOLIC PANEL - Abnormal; Notable for the following:    Glucose, Bld 134 (*)    GFR calc non Af Amer 54 (*)    GFR calc Af Amer 62 (*)    All other components within normal limits  CBC WITH DIFFERENTIAL/PLATELET - Abnormal; Notable for the following:    RBC 3.78 (*)    Neutrophils Relative % 98 (*)    Lymphocytes Relative 1 (*)    Lymphs Abs 0.0 (*)    Monocytes Relative 1 (*)    Monocytes Absolute 0.0 (*)    All other components within normal limits    At D/C; calm and comfortable.  Imaging Review No results found.   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 Her oncologist will contact her in AM to discuss ongoing treatment. Doubt allergic rxn, anaphylaxis or SBI.  Nursing Notes Reviewed/ Care Coordinated Applicable Imaging Reviewed Interpretation of Laboratory Data incorporated into ED treatment  The patient appears reasonably screened and/or stabilized for discharge and I doubt any other medical condition or other [facility] requiring further screening, evaluation, or  treatment in the ED at this time prior to discharge.  Plan: Home Medications- Ativan and IBU; Home Treatments- rest; return here if the recommended treatment, does not improve the symptoms; Recommended follow up- Oncology prn    I personally performed the services described in this documentation, which was scribed in my presence. The recorded information has been reviewed and is accurate.      Richarda Blade, MD 01/18/15 [phone]

## 2015-01-17 NOTE — Discharge Instructions (Signed)
Take ibuprofen, 400 mg, 3 times a day, for 1 week. Follow-up with your oncologist, by phone, tomorrow to discuss the problem which you had today.    Muscle Pain Muscle pain (myalgia) may be caused by many things, including:  Overuse or muscle strain, especially if you are not in shape. This is the most common cause of muscle pain.  Injury.  Bruises.  Viruses, such as the flu.  Infectious diseases.  Fibromyalgia, which is a chronic condition that causes muscle tenderness, fatigue, and headache.  Autoimmune diseases, including lupus.  Certain drugs, including ACE inhibitors and statins. Muscle pain may be mild or severe. In most cases, the pain lasts only a short time and goes away without treatment. To diagnose the cause of your muscle pain, your health care provider will take your medical history. This means he or she will ask you when your muscle pain began and what has been happening. If you have not had muscle pain for very long, your health care provider may want to wait before doing much testing. If your muscle pain has lasted a long time, your health care provider may want to run tests right away. If your health care provider thinks your muscle pain may be caused by illness, you may need to have additional tests to rule out certain conditions.  Treatment for muscle pain depends on the cause. Home care is often enough to relieve muscle pain. Your health care provider may also prescribe anti-inflammatory medicine. HOME CARE INSTRUCTIONS Watch your condition for any changes. The following actions may help to lessen any discomfort you are feeling:  Only take over-the-counter or prescription medicines as directed by your health care provider.  Apply ice to the sore muscle:  Put ice in a plastic bag.  Place a towel between your skin and the bag.  Leave the ice on for 15-20 minutes, 3-4 times a day.  You may alternate applying hot and cold packs to the muscle as directed by your  health care provider.  If overuse is causing your muscle pain, slow down your activities until the pain goes away.  Remember that it is normal to feel some muscle pain after starting a workout program. Muscles that have not been used often will be sore at first.  Do regular, gentle exercises if you are not usually active.  Warm up before exercising to lower your risk of muscle pain.  Do not continue working out if the pain is very bad. Bad pain could mean you have injured a muscle. SEEK MEDICAL CARE IF:  Your muscle pain gets worse, and medicines do not help.  You have muscle pain that lasts longer than 3 days.  You have a rash or fever along with muscle pain.  You have muscle pain after a tick bite.  You have muscle pain while working out, even though you are in good physical condition.  You have redness, soreness, or swelling along with muscle pain.  You have muscle pain after starting a new medicine or changing the dose of a medicine. SEEK IMMEDIATE MEDICAL CARE IF:  You have trouble breathing.  You have trouble swallowing.  You have muscle pain along with a stiff neck, fever, and vomiting.  You have severe muscle weakness or cannot move part of your body. MAKE SURE YOU:   Understand these instructions.  Will watch your condition.  Will get help right away if you are not doing well or get worse. Document Released: 10/18/2006 Document Revised: 12/01/2013 Document  Reviewed: 09/22/2013 ExitCare Patient Information 2015 [place], [place]. This information is not intended to replace advice given to you by your health care provider. Make sure you discuss any questions you have with your health care provider.

## 2015-01-17 NOTE — ED Notes (Signed)
MD at bedside. 

## 2015-01-17 NOTE — ED Notes (Signed)
Pt's O2 fluctuating from 88% to 94%. Pt holding breath while in pain and taking shallow breaths at times. Pt placed on 2L O2 via nasal cannula for comfort.

## 2015-01-17 NOTE — ED Notes (Signed)
Pt brought in by [facility] for c/o generalized pain and chills; pt is a cancer pt and was seen at [facility] for her cancer treatement for melanoma and pt states 15 mins after she left she began having severe pain and chills

## 2015-01-17 NOTE — ED Notes (Signed)
Pt resting in bed. Nad noted. Pt is not moving about in bed as before or holding her breath. Pt currently rates pain 8 out of 10.

## 2015-06-07 ENCOUNTER — Other Ambulatory Visit: Payer: Self-pay | Admitting: Family Medicine

## 2015-06-07 NOTE — Telephone Encounter (Signed)
Needs office visit.

## 2015-07-01 ENCOUNTER — Other Ambulatory Visit: Payer: Self-pay | Admitting: Family Medicine

## 2015-07-07 ENCOUNTER — Other Ambulatory Visit: Payer: Self-pay | Admitting: Family Medicine

## 2015-08-07 ENCOUNTER — Other Ambulatory Visit: Payer: Self-pay | Admitting: Family Medicine

## 2015-08-08 ENCOUNTER — Emergency Department (HOSPITAL_COMMUNITY): Payer: Medicare Other

## 2015-08-08 ENCOUNTER — Emergency Department (HOSPITAL_COMMUNITY)
Admission: EM | Admit: 2015-08-08 | Discharge: 2015-08-08 | Disposition: A | Discharge status: Other Acute Inpatient Hospital | Payer: Medicare Other | Attending: Internal Medicine | Admitting: Internal Medicine

## 2015-08-08 ENCOUNTER — Encounter (HOSPITAL_COMMUNITY): Payer: Self-pay | Admitting: Emergency Medicine

## 2015-08-08 DIAGNOSIS — Z8719 Personal history of other diseases of the digestive system: Secondary | ICD-10-CM | POA: Insufficient documentation

## 2015-08-08 DIAGNOSIS — M419 Scoliosis, unspecified: Secondary | ICD-10-CM | POA: Diagnosis not present

## 2015-08-08 DIAGNOSIS — Z79899 Other long term (current) drug therapy: Secondary | ICD-10-CM | POA: Insufficient documentation

## 2015-08-08 DIAGNOSIS — Z862 Personal history of diseases of the blood and blood-forming organs and certain disorders involving the immune mechanism: Secondary | ICD-10-CM | POA: Insufficient documentation

## 2015-08-08 DIAGNOSIS — M797 Fibromyalgia: Secondary | ICD-10-CM | POA: Insufficient documentation

## 2015-08-08 DIAGNOSIS — Z880 Allergy status to penicillin: Secondary | ICD-10-CM | POA: Diagnosis not present

## 2015-08-08 DIAGNOSIS — C792 Secondary malignant neoplasm of skin: Secondary | ICD-10-CM | POA: Insufficient documentation

## 2015-08-08 DIAGNOSIS — Z85841 Personal history of malignant neoplasm of brain: Secondary | ICD-10-CM | POA: Diagnosis not present

## 2015-08-08 DIAGNOSIS — R29810 Facial weakness: Secondary | ICD-10-CM | POA: Insufficient documentation

## 2015-08-08 DIAGNOSIS — C799 Secondary malignant neoplasm of unspecified site: Secondary | ICD-10-CM

## 2015-08-08 DIAGNOSIS — R42 Dizziness and giddiness: Secondary | ICD-10-CM | POA: Insufficient documentation

## 2015-08-08 DIAGNOSIS — R4701 Aphasia: Secondary | ICD-10-CM | POA: Diagnosis present

## 2015-08-08 DIAGNOSIS — C439 Malignant melanoma of skin, unspecified: Secondary | ICD-10-CM

## 2015-08-08 LAB — CBC WITH DIFFERENTIAL/PLATELET
BASOS ABS: 0 10*3/uL (ref 0.0–0.1)
BASOS PCT: 0 % (ref 0–1)
EOS ABS: 0.2 10*3/uL (ref 0.0–0.7)
EOS PCT: 5 % (ref 0–5)
HCT: 37.9 % (ref 36.0–46.0)
Hemoglobin: 12.7 g/dL (ref 12.0–15.0)
Lymphocytes Relative: 24 % (ref 12–46)
Lymphs Abs: 1 10*3/uL (ref 0.7–4.0)
MCH: 32.7 pg (ref 26.0–34.0)
MCHC: 33.5 g/dL (ref 30.0–36.0)
MCV: 97.7 fL (ref 78.0–100.0)
Monocytes Absolute: 0.6 10*3/uL (ref 0.1–1.0)
Monocytes Relative: 14 % — ABNORMAL HIGH (ref 3–12)
Neutro Abs: 2.4 10*3/uL (ref 1.7–7.7)
Neutrophils Relative %: 57 % (ref 43–77)
PLATELETS: 183 10*3/uL (ref 150–400)
RBC: 3.88 MIL/uL (ref 3.87–5.11)
RDW: 14.6 % (ref 11.5–15.5)
WBC: 4.2 10*3/uL (ref 4.0–10.5)

## 2015-08-08 LAB — PROTIME-INR
INR: 1.07 (ref 0.00–1.49)
Prothrombin Time: 14.1 seconds (ref 11.6–15.2)

## 2015-08-08 LAB — COMPREHENSIVE METABOLIC PANEL
ALT: 25 U/L (ref 14–54)
AST: 28 U/L (ref 15–41)
Albumin: 3.6 g/dL (ref 3.5–5.0)
Alkaline Phosphatase: 57 U/L (ref 38–126)
Anion gap: 6 (ref 5–15)
BILIRUBIN TOTAL: 0.4 mg/dL (ref 0.3–1.2)
BUN: 14 mg/dL (ref 6–20)
CO2: 31 mmol/L (ref 22–32)
CREATININE: 0.99 mg/dL (ref 0.44–1.00)
Calcium: 8.9 mg/dL (ref 8.9–10.3)
Chloride: 104 mmol/L (ref 101–111)
GFR, EST NON AFRICAN AMERICAN: 55 mL/min — AB (ref >60)
Glucose, Bld: 82 mg/dL (ref 65–99)
Potassium: 3.7 mmol/L (ref 3.5–5.1)
Sodium: 141 mmol/L (ref 135–145)
TOTAL PROTEIN: 6.6 g/dL (ref 6.5–8.1)

## 2015-08-08 MED ORDER — DEXAMETHASONE SODIUM PHOSPHATE 10 MG/ML IJ SOLN
10.0000 mg | Freq: Once | INTRAMUSCULAR | Status: AC
  Administered 2015-08-08: 10 mg via INTRAVENOUS
  Filled 2015-08-08: qty 1

## 2015-08-08 NOTE — ED Notes (Signed)
Continue to await bed assignment from [facility].  Pt made aware.

## 2015-08-08 NOTE — ED Notes (Signed)
Also Checked with [facility] earlier to see if they had available truck. "They only have 1 ALS truck in the [place]".

## 2015-08-08 NOTE — ED Notes (Signed)
MD at bedside. 

## 2015-08-08 NOTE — ED Provider Notes (Signed)
CSN: [id number]     Arrival date & time 08/08/15  1018 History   This chart was scribed for Krystal Speak, MD by Thea Alken, ED Scribe. This patient was seen in room APA05/APA05 and the patient's care was started at 10:45 AM. Chief Complaint  Patient presents with  . Aphasia  . Dizziness   The history is provided by the patient. No language interpreter was used.    HPI Comments:  Krystal Carpenter is a 74 y.o. female brought in by her daughter who present to the Emergency Department complaining of slurred speech and left facial droop that began 3-4 weeks ago. 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 She's had slurred speech and left facial drop since early august and was told that symptoms were caused by brain tumor. Per daughter, pt was told by [facility] to come to ED for stoke evaluation for new, worsening symptoms of weakness, numbness,  loss of balance and twitching in legs onset last night. She last had chemotherapy in July which was stopped to do radiation on brain.    Past Medical History  Diagnosis Date  . IBS (irritable bowel syndrome)   . Scoliosis   . Anxiety   . Depression   . Anemia   . Pinched vertebral nerve 2013    lower back  . Osteopenia   . Fibromyalgia   . Hiatal hernia   . Osteoporosis   . Cancer     melonoma  . Boil of buttock 02/03/2014   Past Surgical History  Procedure Laterality Date  . Gallbladder surgery  14 yrs ago    mmh  . Neck surgery      spinal fluis leakage-patched with "pig bladder"  . Foot surgery      bilateral-bunionectomy  . Shoulder surgery      distal clavicle date unknown-right-[place]  . Cholecystectomy  14 yrs ago    mmh  . Back surgery  05/21/12  . Colonoscopy N/A 03/05/2013    Procedure: COLONOSCOPY;  Surgeon: Rogene Houston, MD;  Location: [facility];  Service: Endoscopy;  Laterality: N/A;  125 patient will be at ENDO at 7 am to prep and will bring her own solution for prep   Family History  Problem  Relation Age of Onset  . Heart disease    . Lung disease    . Cancer    . Asthma    . Diabetes    . Anesthesia problems Neg Hx   . Hypotension Neg Hx   . Malignant hyperthermia Neg Hx   . Pseudochol deficiency Neg Hx   . Heart attack Mother   . Heart attack Father   . Hypertension Sister    Social History  Substance Use Topics  . Smoking status: Never Smoker   . Smokeless tobacco: Former Systems developer  . Alcohol Use: No   OB History    Gravida Para Term Preterm AB TAB SAB Ectopic Multiple Living   3 3        3      Review of Systems  Neurological: Positive for facial asymmetry, speech difficulty, weakness and numbness.  All other systems reviewed and are negative.  Allergies  Penicillins and Sulfa antibiotics  Home Medications   Prior to Admission medications   Medication Sig Start Date End Date Taking? Authorizing Provider  BIOTIN PO Take 1 tablet by mouth daily. 12/28/14   Historical Provider, MD  cycloSPORINE (RESTASIS) 0.05 % ophthalmic emulsion Place 1  drop into both eyes 2 (two) times daily as needed (for dry eye relief). PRN    Historical Provider, MD  docusate sodium (COLACE) 100 MG capsule Take 100 mg by mouth daily as needed. FOR CONSTIPATION    Historical Provider, MD  gabapentin (NEURONTIN) 300 MG capsule Take 300 mg by mouth 3 (three) times daily. 01/11/15 01/11/16  Historical Provider, MD  HYDROcodone-acetaminophen (NORCO/VICODIN) 5-325 MG per tablet Take 1 tablet by mouth every 6 (six) hours as needed. FOR PAIN BUT NOT TO TAKE WITH OXYCODONE 12/28/14   Historical Provider, MD  LORazepam (ATIVAN) 0.5 MG tablet Take 1 tablet (0.5 mg total) by mouth every 8 (eight) hours as needed (Muscle spasm). 01/17/15   Daleen Bo, MD  pantoprazole (PROTONIX) 40 MG tablet TAKE ONE (1) TABLET EACH DAY Patient taking differently: Take 40 mg by mouth daily as needed (for acid reflux).  08/24/14   Kathyrn Drown, MD  traZODone (DESYREL) 50 MG tablet TAKE ONE AND ONE-HALF TABLETS AT BEDTIME  07/01/15   Kathyrn Drown, MD  venlafaxine XR (EFFEXOR-XR) 150 MG 24 hr capsule TAKE ONE CAPSULE BY MOUTH DAILY 07/08/15   Mikey Kirschner, MD   BP 140/83 mmHg  Pulse 84  Temp(Src) 98 F (36.7 C) (Oral)  Resp 14  Ht 5\' 4"  (1.626 m)  Wt 129 lb (58.514 kg)  BMI 22.13 kg/m2  SpO2 100% Physical Exam  Constitutional: She is oriented to person, place, and time. She appears well-developed and well-nourished. No distress.  HENT:  Head: Normocephalic and atraumatic.  Eyes: Conjunctivae and EOM are normal. Pupils are equal, round, and reactive to light.  Neck: Neck supple.  Cardiovascular: Normal rate, regular rhythm and normal heart sounds.   No murmur heard. Pulmonary/Chest: Effort normal and breath sounds normal. No respiratory distress. She has no wheezes. She has no rales.  Musculoskeletal: Normal range of motion.  Neurological: She is alert and oriented to person, place, and time.  There is a left facial droop noted. Hand grip is minimal in the left hand. strength is 5/5 in BLE.  Skin: Skin is warm and dry. There is pallor.  Psychiatric: She has a normal mood and affect. Her behavior is normal.  Nursing note and vitals reviewed.   ED Course  Procedures (including critical care time) DIAGNOSTIC STUDIES: Oxygen Saturation is 100% on RA, normal by my interpretation.    COORDINATION OF CARE: 11:06 AM- Pt advised of plan for treatment and pt agrees.  Labs Review Labs Reviewed - No data to display  Imaging Review No results found. I have personally reviewed and evaluated these images and lab results as part of my medical decision-making.   EKG Interpretation None      MDM   Final diagnoses:  None    Patient with history of metastatic melanoma with brain involvement. She presents for evaluation of a several week history of slurred speech and facial droop. She will this morning with difficulty speaking and left hand weakness. She underwent workup including laboratory studies  and CT scan of the brain. Laboratory studies were unremarkable, however head CT shows increased vasogenic edema surrounding the tumor in the right frontal lobe with 7 mm of midline shift.  I have discussed these above findings with Dr. Reynaldo Minium who is the admitting physician at [facility] for oncology, and Dr. Raynelle Chary who is the patient's oncologist. Both of recommended that the patient be transferred for IV steroids and further evaluation. When a bed is ready, the patient will be  transferred.   I personally performed the services described in this documentation, which was scribed in my presence. The recorded information has been reviewed and is accurate.      Krystal Speak, MD 08/08/15 1330

## 2015-08-08 NOTE — ED Notes (Signed)
Called Radiology to obtain Disk for transport.

## 2015-08-08 NOTE — ED Notes (Signed)
[facility] is here to transport Pt to [facility].

## 2015-08-08 NOTE — ED Notes (Signed)
First symptoms was 07/21/15 with slurred speech and mouth drooping.  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  Had radiation on 07/28/15 at [facility].   Seen by Oncology on 08/04/15 for f/u.  Was schedule for chemotherapy but it was canceled.  PET scan Sept 13 th with follow up with Oncology on Sept 14 th.  Being seen at [facility] by Dr April Saloma. Was told to come to ED for evaluation for possible stroke.  Charmaine Downs, PA at [facility] number is [phone].

## 2015-08-08 NOTE — ED Notes (Signed)
Called transfer center to check on available truck.  Was informed it may be latter on tonight.  Returned call to [employer]  and spoke with [employer].  Let him know that we would wait on their truck.  Lurline Hare and EDP made aware.

## 2015-08-08 NOTE — ED Notes (Signed)
Called [facility] for transport to [facility]. Per Nunzio Cobbs at [facility], "may be some time before they have a truck, and we can check to see if [facility] has a truck, and they will in the mean time put [place] on their list".  Estill Bamberg, RN informed.

## 2015-08-11 ENCOUNTER — Inpatient Hospital Stay
Admission: RE | Admit: 2015-08-11 | Discharge: 2015-08-26 | Disposition: A | Discharge status: Home / Self Care | Payer: Medicare Other | Source: Ambulatory Visit | Attending: Internal Medicine | Admitting: Internal Medicine

## 2015-08-17 ENCOUNTER — Non-Acute Institutional Stay (SKILLED_NURSING_FACILITY): Payer: Medicare Other | Admitting: Internal Medicine

## 2015-08-17 DIAGNOSIS — K589 Irritable bowel syndrome without diarrhea: Secondary | ICD-10-CM | POA: Diagnosis not present

## 2015-08-17 DIAGNOSIS — C7931 Secondary malignant neoplasm of brain: Secondary | ICD-10-CM

## 2015-08-17 DIAGNOSIS — C439 Malignant melanoma of skin, unspecified: Secondary | ICD-10-CM | POA: Diagnosis not present

## 2015-08-17 DIAGNOSIS — B370 Candidal stomatitis: Secondary | ICD-10-CM

## 2015-08-23 NOTE — Progress Notes (Signed)
Patient ID: Krystal Carpenter, female   DOB: February 03, 1941, 74 y.o.   MRN: [id number]                HISTORY & PHYSICAL  DATE:  08/17/2015         FACILITY: [facility]                       LEVEL OF CARE:   SNF   CHIEF COMPLAINT:  Admission to SNF, post stay at [facility], 08/08/2015 through 08/11/2015.      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      She was discovered to have a melanoma in the medial aspect of her right ankle, which was surgically excised, apparently in 2013.   It was then noted to spread into her lymph nodes for which she had surgery again, and then this has apparently become widely metastatic.    Most recently in August, she was discovered to have a new frontal brain metastasis.  She was placed on Decadron and received stereotactic radiosurgery.  In spite of this, she worsened with left facial weakness, drooling, and difficulty using her left hand.  She was transported to [facility] when a CT scan at [facility] ER on 08/08/2015 showed extensive vasogenic edema in the right frontal lobe causing 7 mm of midline shift.  She was transferred to [facility] and put on Decadron, ultimately at 4 mg every eight hours.  She has noted remarkable improvement in her face.  She can use her hand well which she demonstrates to me, and her gait is stabilizing.  She is due to see several consultants next week, I believe, including Oncology and Neurology, and they will manage her steroids.    PAST MEDICAL HISTORY/PROBLEM LIST:                     Irritable bowel syndrome.    Scoliosis.    Depression with anxiety.     Anemia.   Osteopenia.     Fibromyalgia.    Hiatal hernia.      Osteoporosis.     Metastatic melanoma, as noted.      PAST SURGICAL HISTORY:            Gallbladder surgery.      Occipital surgery for Budd-Chiari syndrome.     Bilateral bunionectomy.     Distal clavicle surgery.    Cholecystectomy 14 years ago.      Back surgery in June 2013.     CURRENT MEDICATIONS:  Medication list is reviewed.    SOCIAL HISTORY:                   HOUSING:  The patient states she lives on her own, but will go from here to live with her daughter for two weeks before attempting to go back home.        REVIEW OF SYSTEMS:       HEENT:   No headache or dizziness.    CHEST/RESPIRATORY:  No shortness of breath.   CARDIAC:  No chest pain.    GI:  She states she has constipation, dependent irritable bowel and currently is constipated.   NEUROLOGICAL:  Gait:  States this is much better.      PHYSICAL EXAMINATION:   GENERAL APPEARANCE:  The patient looks in absolutely no distress.  Her speech is clear.    HEENT:    MOUTH/THROAT:  She has thrush on her hard palate.    CHEST/RESPIRATORY:  Clear air entry bilaterally.     CARDIOVASCULAR:   CARDIAC:  Heart sounds are normal.  There are no murmurs.   GASTROINTESTINAL:   LIVER/SPLEEN/KIDNEY:   No liver, no spleen.  No tenderness.    GENITOURINARY:   BLADDER:  Not enlarged.     SKIN:   INSPECTION:  She shows me multiple areas of melanoma predominantly on the upper right and posterior right thigh.   NEUROLOGICAL:   CRANIAL NERVES:  Normal.  Her visual fields are intact.   MOTOR:  Perhaps mild left pronator drift.  That is all I could really find.     BALANCE/GAIT:  She is able to get up and walk steadily.    ASSESSMENT/PLAN:                 Metastatic melanoma, including a new right brain lesion.  She is improving on the Decadron.  She will be managed by her consultants at [facility].    Oral thrush.    Irritable bowel syndrome, constipation, dependent.  I am going to start her on MiraLAX.         CPT CODE: 80881

## 2015-08-24 ENCOUNTER — Non-Acute Institutional Stay (SKILLED_NURSING_FACILITY): Payer: Medicare Other | Admitting: Internal Medicine

## 2015-08-24 ENCOUNTER — Encounter: Payer: Self-pay | Admitting: Internal Medicine

## 2015-08-24 DIAGNOSIS — F329 Major depressive disorder, single episode, unspecified: Secondary | ICD-10-CM | POA: Diagnosis not present

## 2015-08-24 DIAGNOSIS — B370 Candidal stomatitis: Secondary | ICD-10-CM

## 2015-08-24 DIAGNOSIS — F32A Depression, unspecified: Secondary | ICD-10-CM

## 2015-08-24 DIAGNOSIS — C439 Malignant melanoma of skin, unspecified: Secondary | ICD-10-CM | POA: Diagnosis not present

## 2015-08-24 NOTE — Progress Notes (Signed)
Patient ID: Krystal Carpenter, female   DOB: December 06, 1941, 74 y.o.   MRN: [id number]         This is a discharge note        FACILITY: [facility]:   SNF   [place] Discharge note.      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      She was discovered to have a melanoma in the medial aspect of her right ankle, which was surgically excised, apparently in 2013.   It was then noted to spread into her lymph nodes for which she had surgery again, and then this has apparently become widely metastatic.    Most recently in August, she was discovered to have a new frontal brain metastasis.  She was placed on Decadron and received stereotactic radiosurgery.  In spite of this, she worsened with left facial weakness, drooling, and difficulty using her left hand.  She was transported to [facility] when a CT scan at [facility] ER on 08/08/2015 showed extensive vasogenic edema in the right frontal lobe causing 7 mm of midline shift.  She was transferred to [facility] and put on Decadron, ultimately at 4 mg every eight hours.  She has noted remarkable improvement in her face.  She can use her hand welll--is ambulatory but somewhat weak.  She is following with neurology and oncology and they are adjusting her steroid-induced she is currently on Decadron 4 mg every 8 hours.    PAST MEDICAL HISTORY/PROBLEM LIST:                     Irritable bowel syndrome.    Scoliosis.    Depression with anxiety.     Anemia.   Osteopenia.     Fibromyalgia.    Hiatal hernia.      Osteoporosis.     Metastatic melanoma, as noted.      PAST SURGICAL HISTORY:            Gallbladder surgery.      Occipital surgery for Budd-Chiari syndrome.     Bilateral bunionectomy.    Distal clavicle surgery.    Cholecystectomy 14 years ago.      Back surgery in June 2013.      CURRENT MEDICATIONS:  Medication list is reviewed.  Tylenol 6 50 mg every 6 hours when necessary pain.  Calcium magnesium and St. twice a day.  Vitamin D 5000 units daily.  Colace 100 mg daily at bedtime.  Dexamethasone 4 mg 3 times a day.  Effexor XR 150 mg daily.  MiraLAX as needed daily.  Naproxen 220 mg when necessary pain twice a day.  Nystatin swish and swallow 5 mL 4 times a day  Pred forte 1 drop both eyes 3 times a day protonic 40 mg daily.  Trazodone 50 mg daily at bedtime    SOCIAL HISTORY:                   HOUSING:  The patient states she lives on her own, but will go from here to live with her daughter for two weeks before attempting to go back home.        REVIEW OF SYSTEMS Gen. no complaints of fever or chills  says he feels weak but improved.  Skin does not complain of rashes or itching again does have metastatic melanoma:       HEENT:   No headache or dizziness.    CHEST/RESPIRATORY:  No shortness of breath.   CARDIAC:  No chest pain.    GI:  Not complaining of abdominal pain had complain of constipation earlier apparently this has improved.  Musculoskeletal has gained strength does not complain of joint pain   NEUROLOGICAL:  Gait:  States this is much better not complaining of headache or dizziness.  Psych does have some history of depression but this appears to be stable.      PHYSICAL EXAMINATION Temperature 97.7 pulse 84 respirations 22 blood pressure 143/78-148/72-131/69 most recently weight appears to be stable at 131.7:   GENERAL APPEARANCE:  The patient looks in absolutely no distress.  Her speech is clear.    HEENT:    MOUTH/THROAT: Still has a slight whitish brown film on her tongue    CHEST/RESPIRATORY:  Clear air entry bilaterally.     CARDIOVASCULAR:   CARDIAC:  Heart sounds are normal.  There are no murmurs. Has lright leg edema which is not new and actually per her daughter and patient improved   GASTROINTESTINAL:    Abdomen is  soft nontender with positive bowel sounds    .     SKIN:   INSPECTION:  multiple areas of melanoma on the right leg h.   NEUROLOGICAL:   CRANIAL NERVES:  Normal.  Her visual fields are intact.         BALANCE/GAIT:  She is able to get up and walk steadily-I did not note any deformities again she does have edema of her right leg--this does not really extend to her foot  Labs.  08/08/2015.  Sodium 141 potassium 3.7 BUN 14 creatinine 0.99.  Liver function tests within normal limits.  WBC 4.2 hemoglobin 12.7 platelets 183.    ASSESSMENT/PLAN:                 Metastatic melanoma, including a new right brain lesion.  She is improving on the Decadron.  She will be managed by her consultants at [facility] she actually saw them today they did a PET scan which apparently showed stability-.    Oral thrush. --Still appears to have a small amount will extend the mouthwash to continue beyond discharge   Irritable bowel syndrome, constipation, dependent.  on MiraLAX  And Colace this appears to have stabilized.   History of insomnia-she is on trazodone  . History depression she is on Effexor this appears to be stable appears to be in good spirits with an optimistic outlook  Patient will need continued PT and OT for strengthening as well as nursing support for follow up her medical issues including history of metastatic melanoma-.  She will be going home with her daughter who is very supportive and was in the room with her today   [phone] note greater than 30 minutes spent on this discharge summary-greater than 50% of time spent coordinating plan of care including reviewing her chart and labs           CPT CODE: 57322

## 2015-08-29 DIAGNOSIS — C439 Malignant melanoma of skin, unspecified: Secondary | ICD-10-CM | POA: Diagnosis not present

## 2015-08-29 DIAGNOSIS — J449 Chronic obstructive pulmonary disease, unspecified: Secondary | ICD-10-CM | POA: Diagnosis not present

## 2015-08-29 DIAGNOSIS — C7931 Secondary malignant neoplasm of brain: Secondary | ICD-10-CM | POA: Diagnosis not present

## 2015-09-06 ENCOUNTER — Ambulatory Visit: Payer: Medicare Other | Admitting: Family Medicine

## 2015-10-20 ENCOUNTER — Other Ambulatory Visit: Payer: Self-pay | Admitting: Family Medicine

## 2015-12-11 DEATH — deceased

## 2017-05-30 IMAGING — CT CT HEAD W/O CM
1 series · 15 of 30 positions shown, 19 images · non-contrast
Comparison: 10/07/2014

CLINICAL DATA: Left arm numbness. Slurred speech. History of
metastatic melanoma.

EXAM:
CT HEAD WITHOUT CONTRAST
TECHNIQUE: Contiguous axial images were obtained from the base of the skull
through the vertex without contrast.

[Series 2: headseq 4.8 h37s · axial · 0.43mm/px · z∈[+71,+223]mm · 15 of 36 slices shown, 19 images]
[im 2/36  brain]
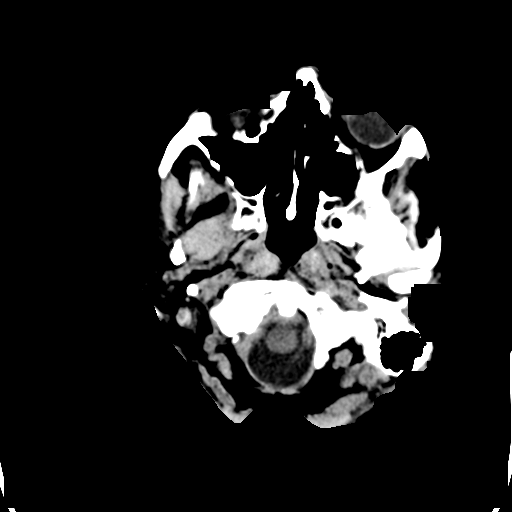
[im 2/36  bone]
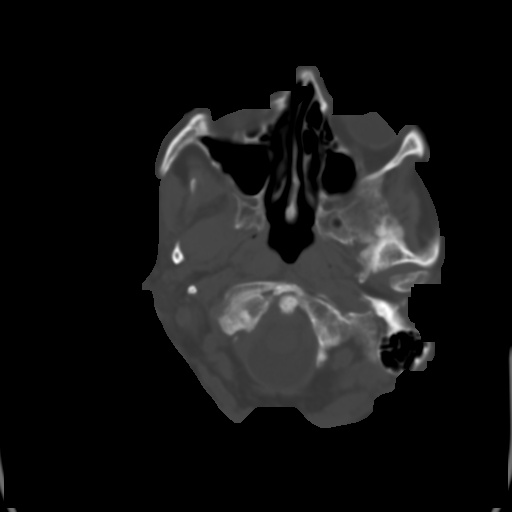
[im 4/36  brain]
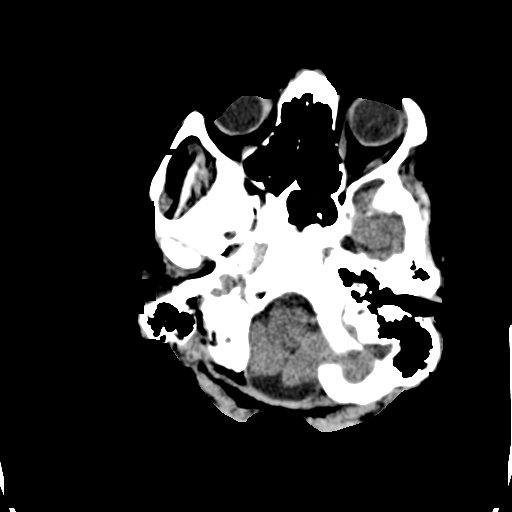
[im 7/36  brain]
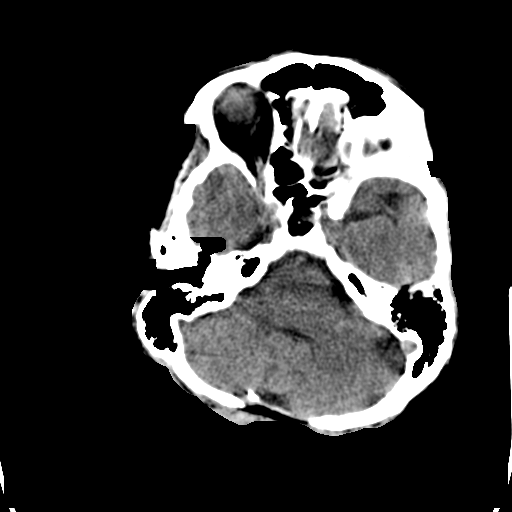
[im 9/36  brain]
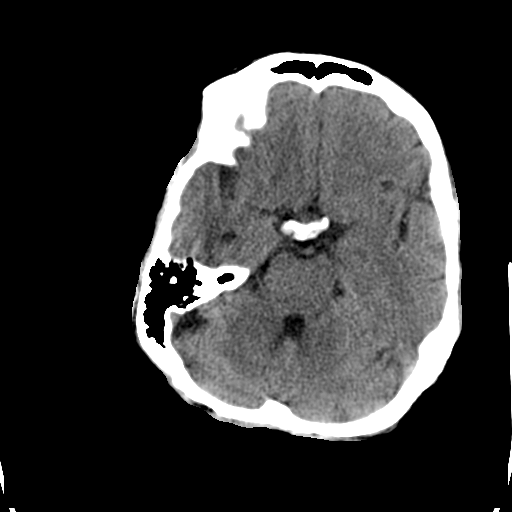
[im 11/36  brain]
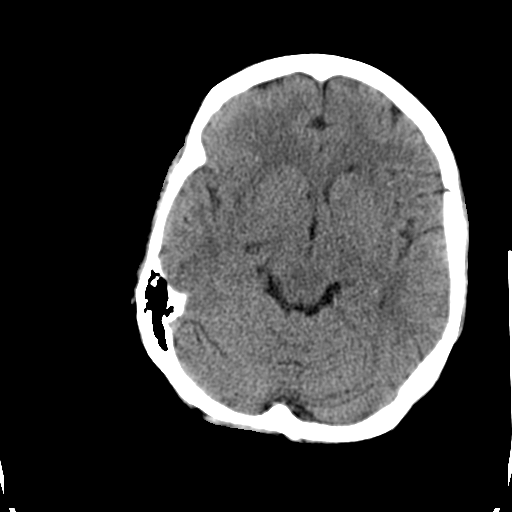
[im 11/36  bone]
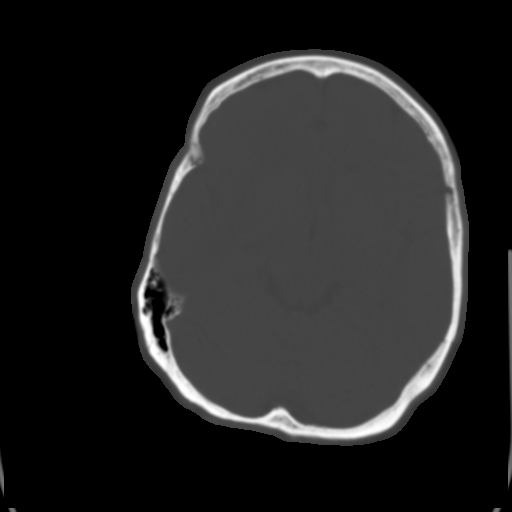
[im 14/36  brain]
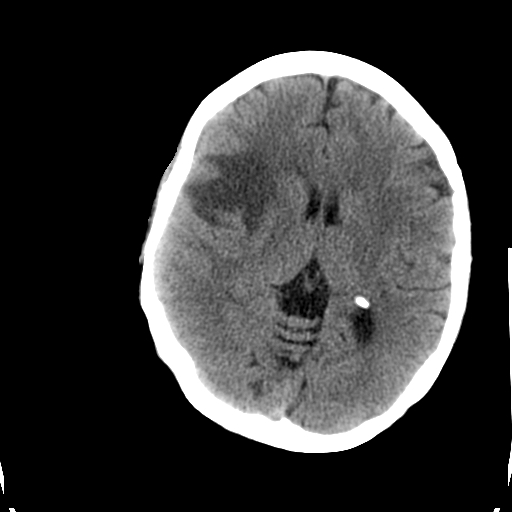
[im 16/36  brain]
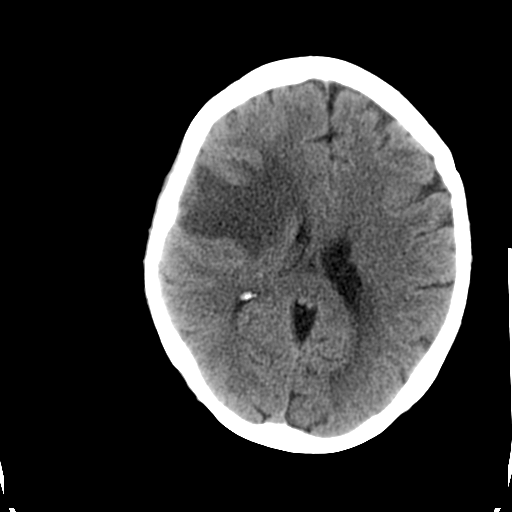
[im 19/36  brain]
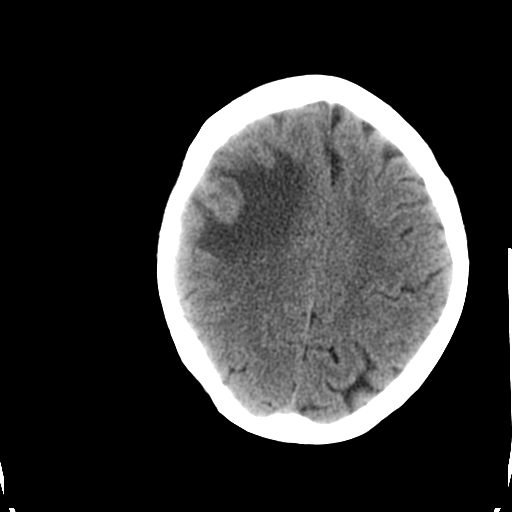
[im 20/36  brain]
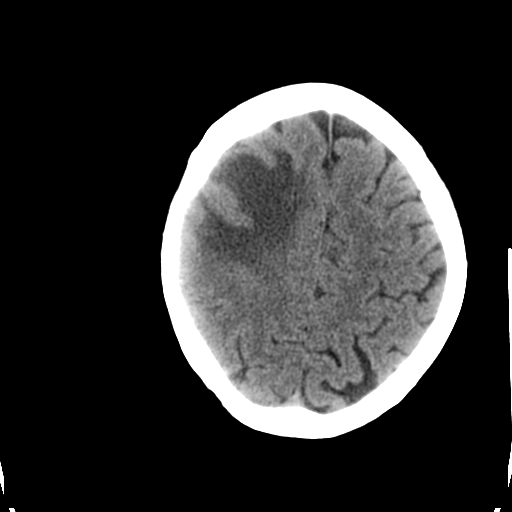
[im 20/36  bone]
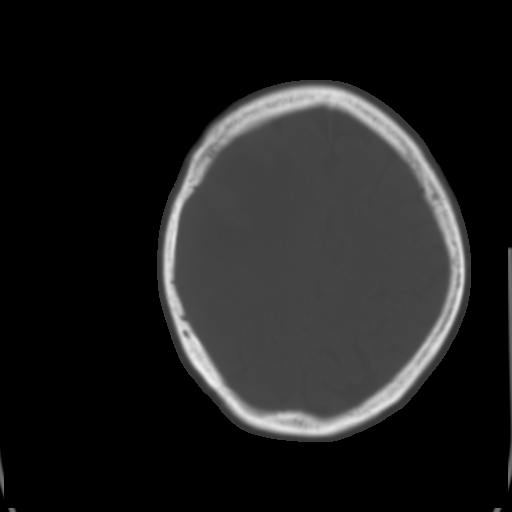
[im 22/36  brain]
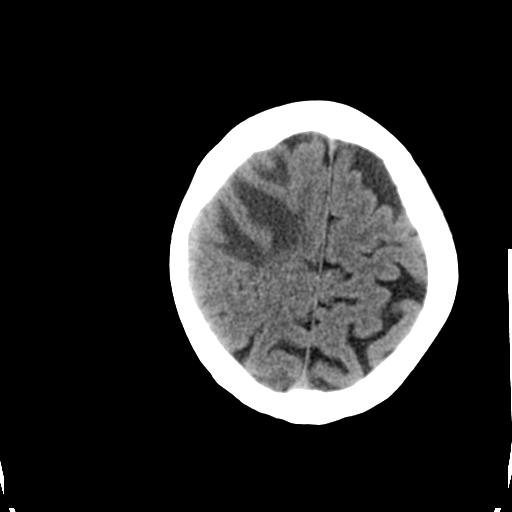
[im 25/36  brain]
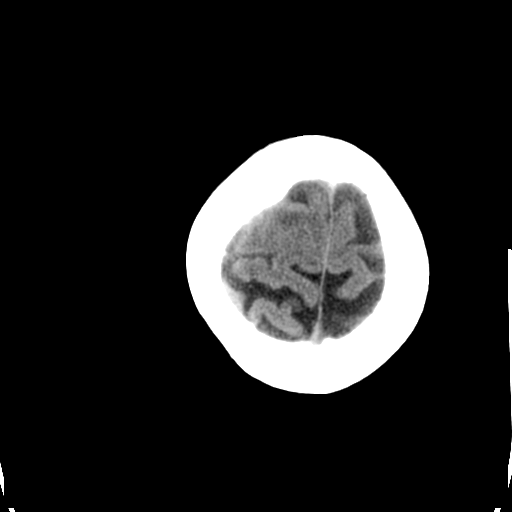
[im 27/36  brain]
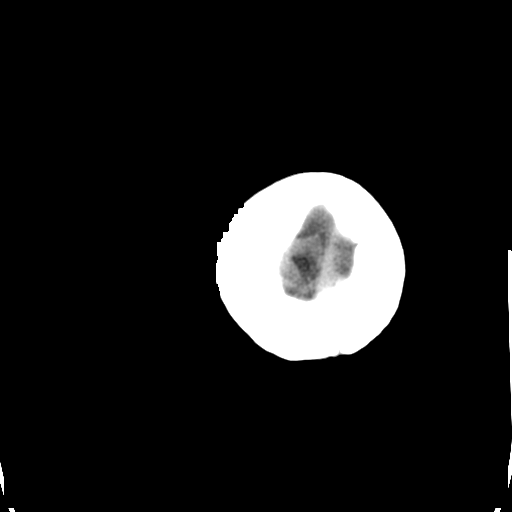
[im 29/36  brain]
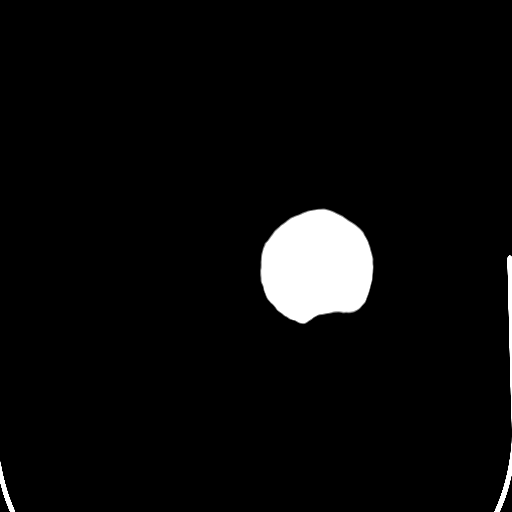
[im 29/36  bone]
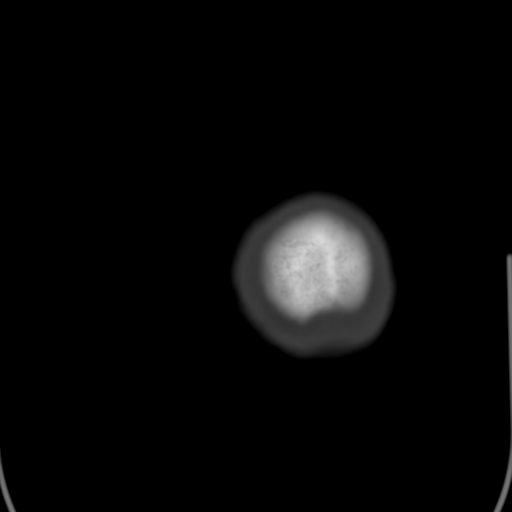
[im 32/36  brain]
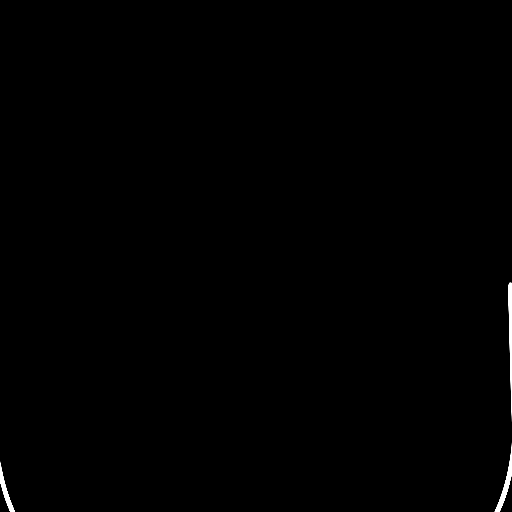
[im 34/36  brain]
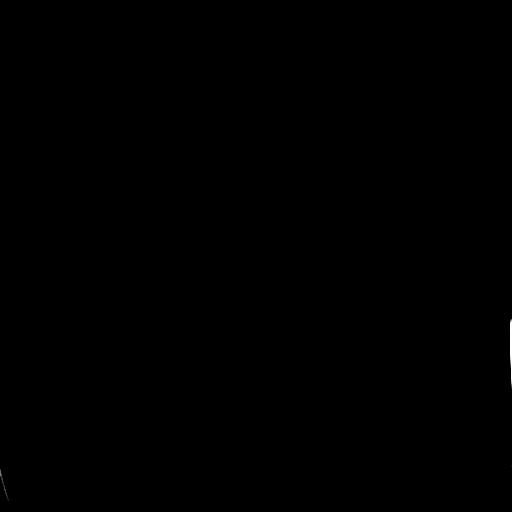

[15 of 30 positions shown; findings below may reference images not displayed]

FINDINGS: There is extensive vasogenic edema throughout the right frontal lobe
with 7 mm of right to left midline shift. An underlying lesion is
not clearly identified. There is no evidence for acute hemorrhage.
There is no significant enlargement of the ventricles. Patient had a
suboccipital craniotomy. Visualized paranasal sinuses are clear.
Question a subtle low-density lesion in the left frontal-temporal
region on sequence 2, image 13.
IMPRESSION: Extensive vasogenic edema in the right frontal lobe causing 7 mm of
midline shift. Findings are concerning for an intracranial lesion
with vasogenic edema. Findings are most likely related the patient's
known metastatic melanoma.

No evidence for acute hemorrhage.

These results were called by telephone at the time of interpretation
on 08/08/2015 at [DATE] to Dr. JOSSY RONEY , who verbally
acknowledged these results.
